# Patient Record
Sex: Female | Born: 1990 | Race: Black or African American | Hispanic: No | Marital: Single | Smoking: Former smoker
Health system: Southern US, Community
[De-identification: ages and names within clinical notes are randomized; demographics above are authoritative.]

## PROBLEM LIST (undated history)

## (undated) DIAGNOSIS — F319 Bipolar disorder, unspecified: Secondary | ICD-10-CM

## (undated) DIAGNOSIS — A749 Chlamydial infection, unspecified: Secondary | ICD-10-CM

## (undated) DIAGNOSIS — F410 Panic disorder [episodic paroxysmal anxiety] without agoraphobia: Secondary | ICD-10-CM

## (undated) DIAGNOSIS — F32A Depression, unspecified: Secondary | ICD-10-CM

## (undated) DIAGNOSIS — F329 Major depressive disorder, single episode, unspecified: Secondary | ICD-10-CM

## (undated) DIAGNOSIS — F419 Anxiety disorder, unspecified: Secondary | ICD-10-CM

---
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
## 2011-01-22 ENCOUNTER — Emergency Department (HOSPITAL_BASED_OUTPATIENT_CLINIC_OR_DEPARTMENT_OTHER)
Admission: EM | Admit: 2011-01-22 | Discharge: 2011-01-22 | Disposition: A | Discharge status: Home / Self Care | Payer: Self-pay | Attending: Emergency Medicine | Admitting: Emergency Medicine

## 2011-01-22 ENCOUNTER — Encounter: Payer: Self-pay | Admitting: Emergency Medicine

## 2011-01-22 DIAGNOSIS — B9689 Other specified bacterial agents as the cause of diseases classified elsewhere: Secondary | ICD-10-CM | POA: Insufficient documentation

## 2011-01-22 DIAGNOSIS — A499 Bacterial infection, unspecified: Secondary | ICD-10-CM | POA: Insufficient documentation

## 2011-01-22 DIAGNOSIS — N760 Acute vaginitis: Secondary | ICD-10-CM | POA: Insufficient documentation

## 2011-01-22 DIAGNOSIS — B009 Herpesviral infection, unspecified: Secondary | ICD-10-CM | POA: Insufficient documentation

## 2011-01-22 DIAGNOSIS — R109 Unspecified abdominal pain: Secondary | ICD-10-CM | POA: Insufficient documentation

## 2011-01-22 HISTORY — DX: Chlamydial infection, unspecified: A74.9

## 2011-01-22 LAB — URINALYSIS, ROUTINE W REFLEX MICROSCOPIC
Glucose, UA: NEGATIVE mg/dL
Ketones, ur: 15 mg/dL — AB
Leukocytes, UA: NEGATIVE
Nitrite: NEGATIVE
Protein, ur: NEGATIVE mg/dL
pH: 5.5 (ref 5.0–8.0)

## 2011-01-22 LAB — WET PREP, GENITAL: Trich, Wet Prep: NONE SEEN

## 2011-01-22 LAB — PREGNANCY, URINE: Preg Test, Ur: NEGATIVE

## 2011-01-22 LAB — URINE MICROSCOPIC-ADD ON

## 2011-01-22 MED ORDER — VALACYCLOVIR HCL 1 G PO TABS: ORAL_TABLET | ORAL | Status: DC

## 2011-01-22 MED ORDER — METRONIDAZOLE 500 MG PO TABS: 500.0000 mg | ORAL_TABLET | Freq: Two times a day (BID) | ORAL | Status: AC

## 2011-01-22 NOTE — ED Provider Notes (Signed)
History  Scribed for Pamela Cooper III, MD, the patient was seen in [facility]. The chart was scribed by Gilman Schmidt. The patients care was started at 4:21 PM.   CSN: [id number] Arrival date & time: 01/22/2011  4:04 PM   First MD Initiated Contact with Patient 01/22/11 1614      Chief Complaint  Patient presents with  . Abdominal Pain    HPI Pamela Obrien is a 20 y.o. female who presents to the Emergency Department complaining of abdominal pain. Pt was seen at [facility] two weeks ago for abdominal pain & "was tested for everything"- +for chlamydia- tx completed- . States that symptoms of back pain, abdominal pain, N/D, chest pain, and irritability still persists. Pt also notes cold sore on lip onset today. Denies any fever, cough, sneezing, vomiting, runny nose, or any urinary symptoms. LNMP: October. Notes she has had vaginal bleeding that is possibly from STD. There are no other associated symptoms and no other alleviating or aggravating factors.   Past Medical History  Diagnosis Date  . Chlamydia     History reviewed. No pertinent past surgical history.  History reviewed. No pertinent family history.  History  Substance Use Topics  . Smoking status: Current Some Day Smoker  . Smokeless tobacco: Not on file  . Alcohol Use: Yes     occ    OB History    Grav Para Term Preterm Abortions TAB SAB Ect Mult Living                  Review of Systems  HENT: Negative for rhinorrhea.   Cardiovascular: Positive for chest pain.  Gastrointestinal: Positive for nausea and abdominal pain. Negative for vomiting and diarrhea.  Genitourinary: Negative for dysuria, urgency, enuresis and difficulty urinating.  Musculoskeletal: Positive for back pain.  Skin: Negative for rash.  Neurological: Negative for dizziness and syncope.  All other systems reviewed and are negative.    Allergies  Review of patient's allergies indicates no known allergies.  Home Medications   Current  Outpatient Rx  Name Route Sig Dispense Refill  . ETONOGESTREL 68 MG [place] IMPL Subcutaneous Inject 1 each into the skin once. Implanted 02/17/2010     . METRONIDAZOLE 500 MG PO TABS Oral Take 1 tablet (500 mg total) by mouth 2 (two) times daily. 14 tablet 0  . VALACYCLOVIR HCL 1 G PO TABS  Take 2 tablets every 12 hours for two doses. 4 tablet 0    BP 133/76  Pulse 94  Temp(Src) 98.4 F (36.9 C) (Oral)  Resp 20  SpO2 100%  Physical Exam  Constitutional: She is oriented to person, place, and time. She appears well-developed and well-nourished.  Non-toxic appearance. She does not have a sickly appearance.  HENT:  Head: Normocephalic and atraumatic.       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 Pupils are equal, round, and reactive to light. No scleral icterus.  Neck: Trachea normal and normal range of motion. Neck supple.  Cardiovascular: Regular rhythm and normal heart sounds.   Pulmonary/Chest: Effort normal and breath sounds normal.  Abdominal: Soft. Normal appearance. There is tenderness in the suprapubic area. There is no rebound, no guarding and no CVA tenderness.  Genitourinary: Uterus normal. Right adnexum displays no tenderness. Left adnexum displays no tenderness.       Normal external genitalia Blood purulent discharge  Musculoskeletal: Normal range of motion.       Lumbar back: She exhibits  tenderness.  Neurological: She is alert and oriented to person, place, and time. She has normal strength.  Skin: Skin is warm, dry and intact. No rash noted.    ED Course  Procedures  DIAGNOSTIC STUDIES: Oxygen Saturation is 100% on room air, normal by my interpretation.    COORDINATION OF CARE: 4:21pm:  - Patient evaluated by ED physician, UA, Pregnancy, Urine Culture, GC, Wet prep ordered  Results for orders placed during the hospital encounter of 01/22/11  URINALYSIS, ROUTINE W REFLEX MICROSCOPIC      Component Value Range   Color, Urine YELLOW  YELLOW     Appearance CLOUDY (*) CLEAR    Specific Gravity, Urine 1.030  1.005 - 1.030    pH 5.5  5.0 - 8.0    Glucose, UA NEGATIVE  NEGATIVE (mg/dL)   Hgb urine dipstick LARGE (*) NEGATIVE    Bilirubin Urine SMALL (*) NEGATIVE    Ketones, ur 15 (*) NEGATIVE (mg/dL)   Protein, ur NEGATIVE  NEGATIVE (mg/dL)   Urobilinogen, UA 0.2  0.0 - 1.0 (mg/dL)   Nitrite NEGATIVE  NEGATIVE    Leukocytes, UA NEGATIVE  NEGATIVE   PREGNANCY, URINE      Component Value Range   Preg Test, Ur NEGATIVE    WET PREP, GENITAL      Component Value Range   Yeast, Wet Prep NONE SEEN  NONE SEEN    Trich, Wet Prep NONE SEEN  NONE SEEN    Clue Cells, Wet Prep TOO NUMEROUS TO COUNT (*) NONE SEEN    WBC, Wet Prep HPF POC FEW (*) NONE SEEN   URINE MICROSCOPIC-ADD ON      Component Value Range   Squamous Epithelial / LPF MANY (*) RARE    WBC, UA 0-2  <3 (WBC/hpf)   RBC / HPF 0-2  <3 (RBC/hpf)   Bacteria, UA MANY (*) RARE    Urine-Other MUCOUS PRESENT     7:05 PM Lab workup shows bacterial vaginosis.  Rx metronidazole. . I personally performed the services described in this documentation, which was scribed in my presence. The recorded information has been reviewed and considered.  Osvaldo Human, M.D.       Pamela Cooper III, MD 01/22/11 [phone]

## 2011-01-22 NOTE — ED Notes (Signed)
Pt was seen at [facility] 2 wks ago for abd pain & "was tested for everything"- +for chlamydia- tx completed- still c/o BLQ pain & low back pain; reports vaginal d/c (watery, white)

## 2011-01-23 LAB — URINE CULTURE
Colony Count: 6000
Culture  Setup Time: 201211181943

## 2011-01-23 LAB — GC/CHLAMYDIA PROBE AMP, GENITAL: Chlamydia, DNA Probe: NEGATIVE

## 2011-02-09 IMAGING — CR DG CHEST 2V
2 series · 2 of 2 positions shown · non-contrast
Comparison: None

CLINICAL DATA: Chest pain.  Anxiety attack.  Syncope.  6 weeks
pregnant.

CHEST - 2 VIEW

[w chest pa]
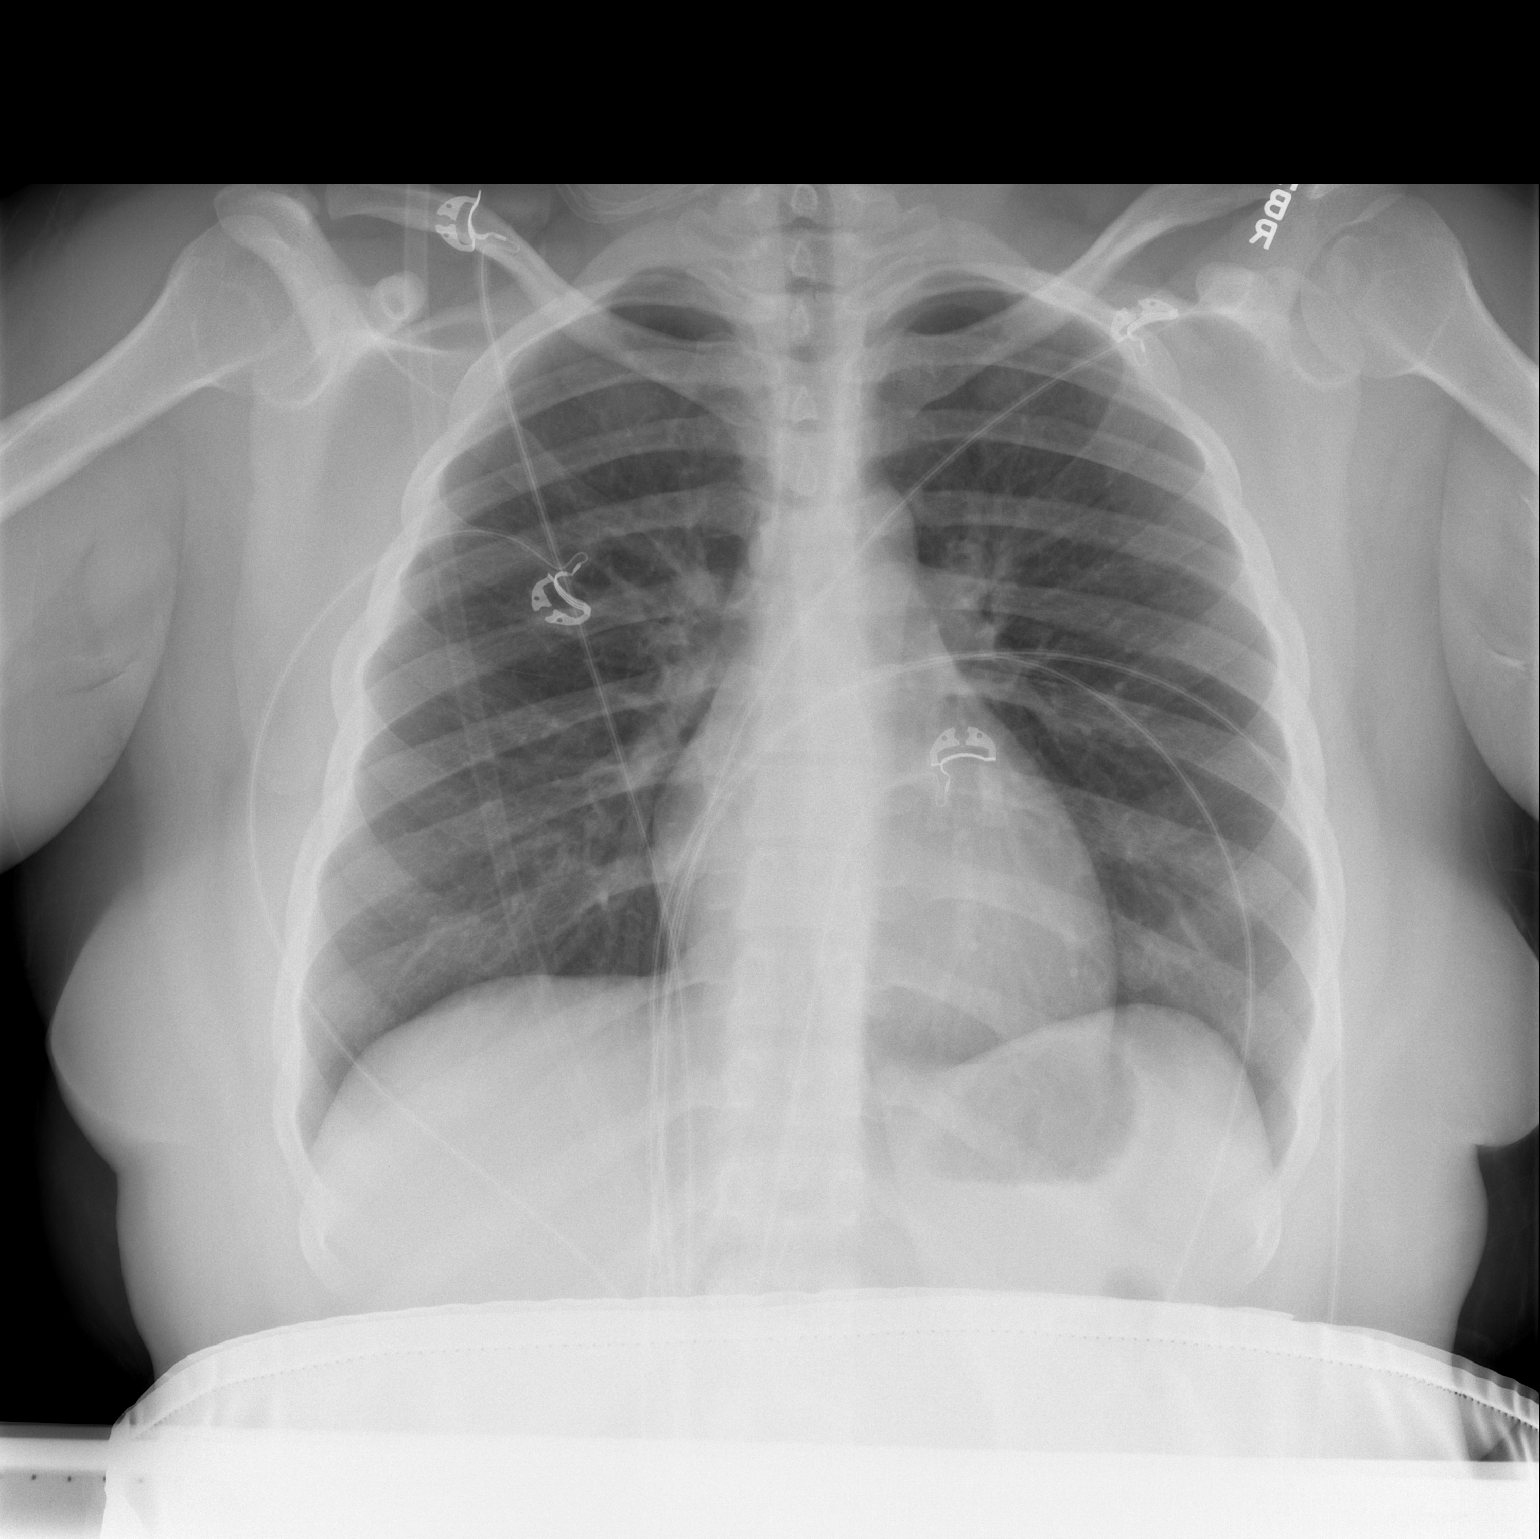

[w chest lat]
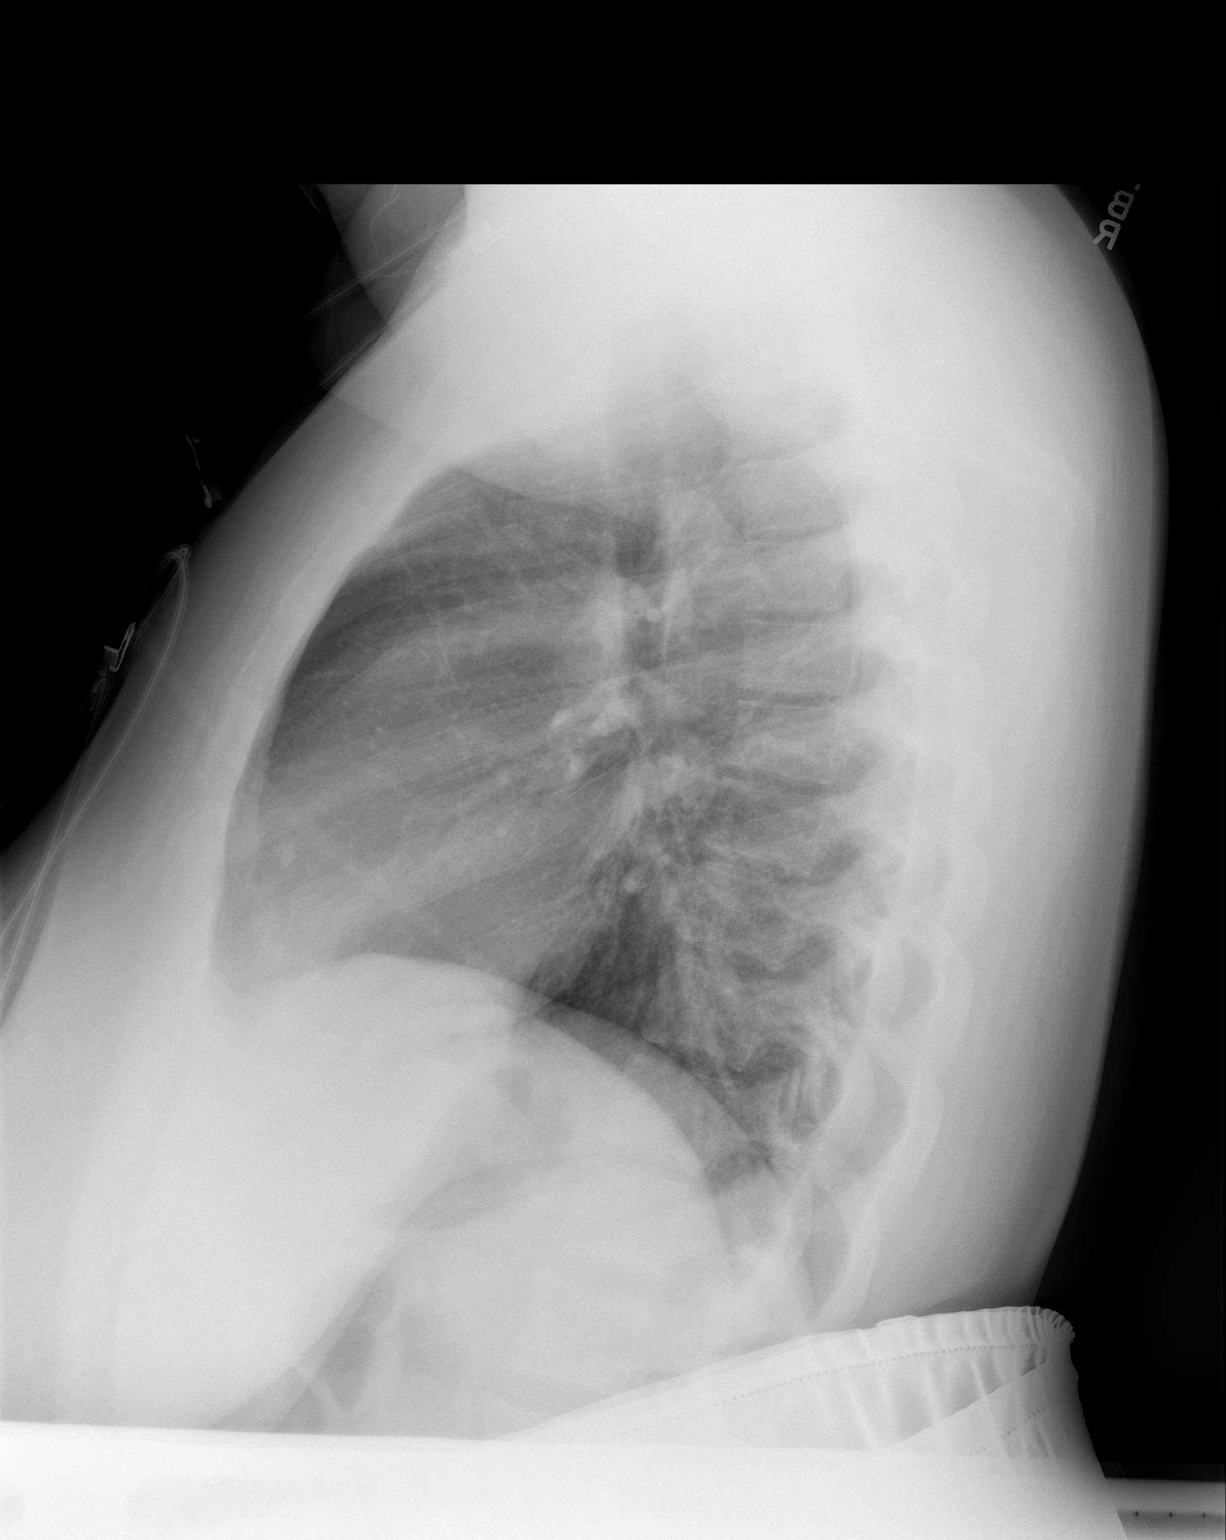

[2 of 2 positions shown; findings below may reference images not displayed]

FINDINGS: Midline trachea.  Normal heart size and mediastinal
contours. No pleural effusion or pneumothorax.  Clear lungs.
IMPRESSION: No acute cardiopulmonary disease.

## 2011-02-09 IMAGING — US US OB COMP LESS 14 WK
1 series · 14 of 28 positions shown · non-contrast
Comparison: None.

CLINICAL DATA: Pelvic pain and cramping.  Vaginal bleeding. 6-week-
4-day gestational age by LMP

OBSTETRIC <14 WK US AND TRANSVAGINAL OB US
TECHNIQUE: Both transabdominal and transvaginal ultrasound
examinations were performed for complete evaluation of the
gestation as well as the maternal uterus, adnexal regions, and
pelvic cul-de-sac.

[Series 1: us ob comp less 14 wk · 0.24mm/px · 14 of 57 slices shown]
[im 3/57]
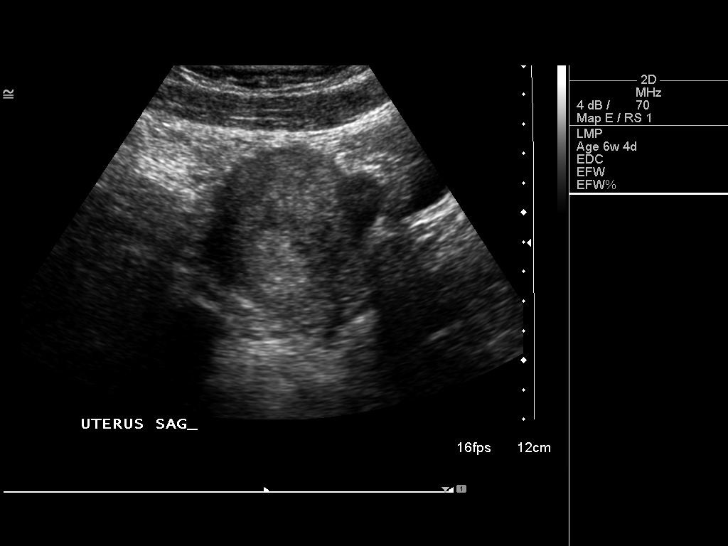
[im 7/57]
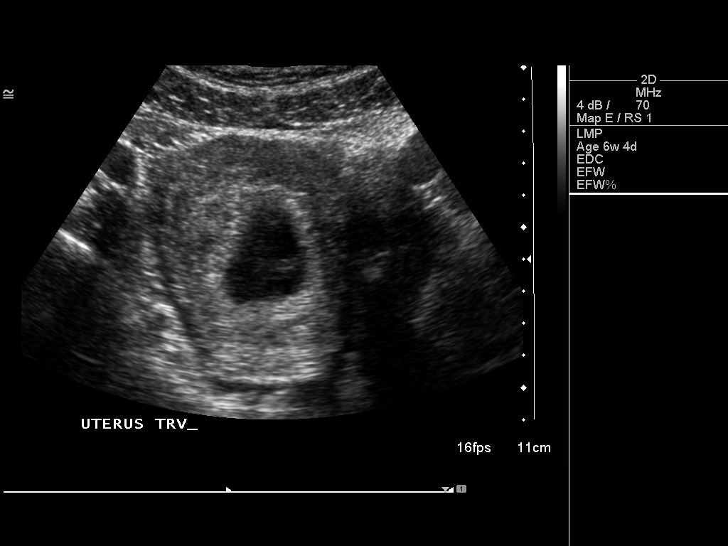
[im 11/57]
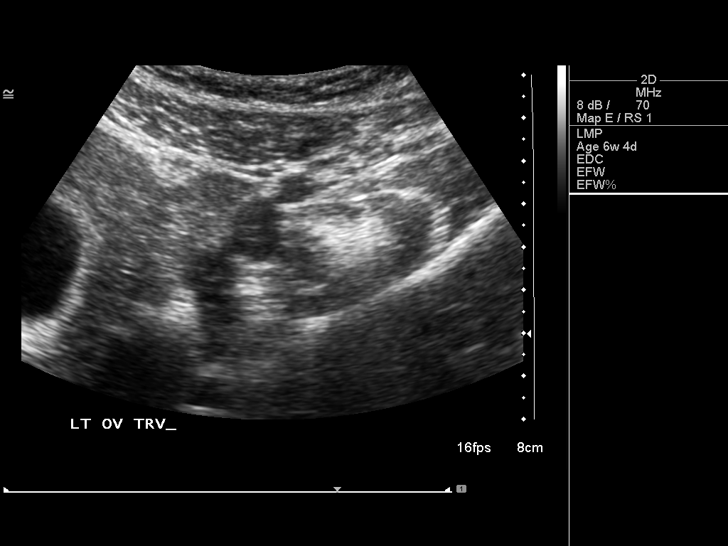
[im 15/57]
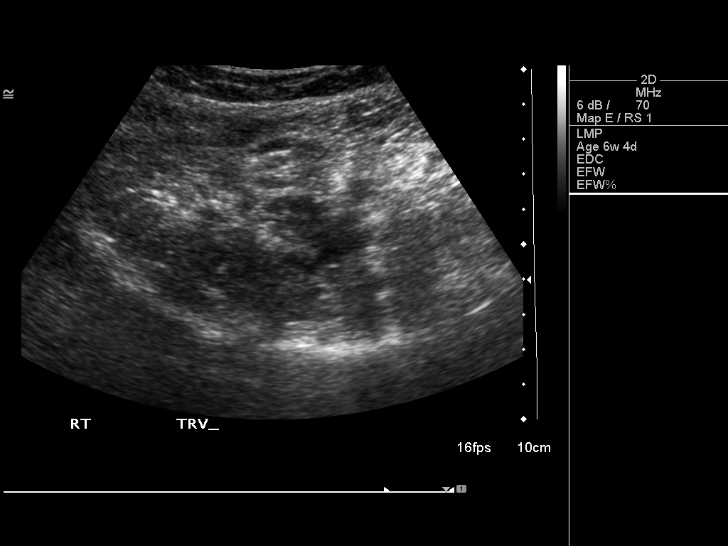
[im 19/57]
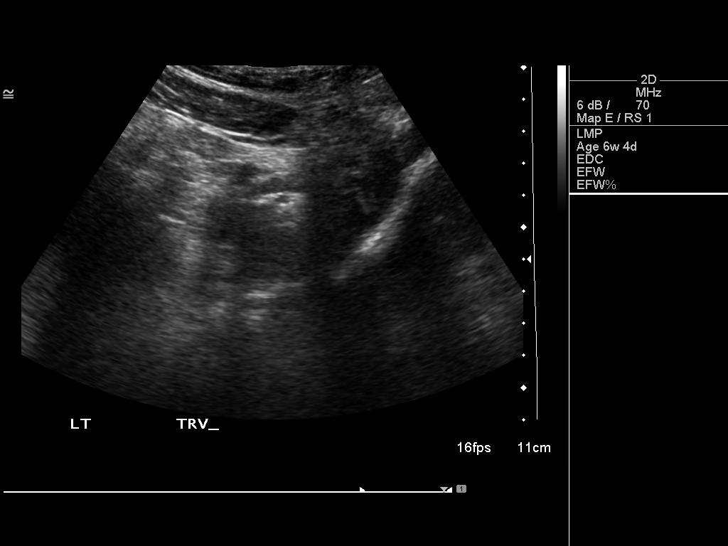
[im 23/57]
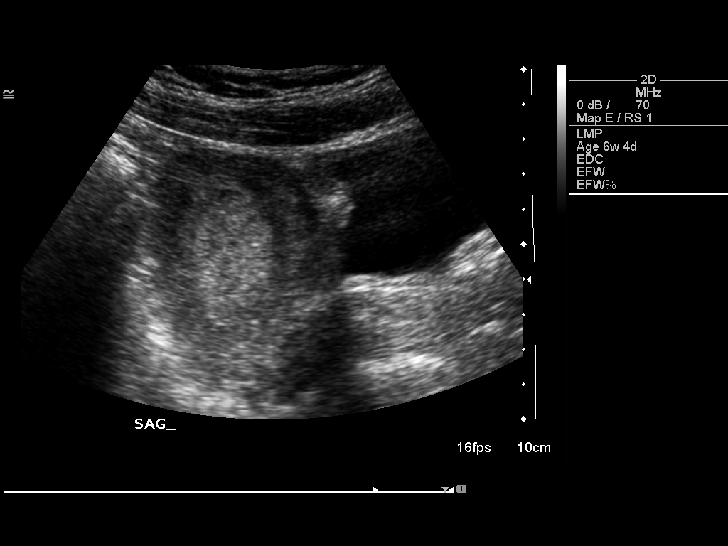
[im 27/57]
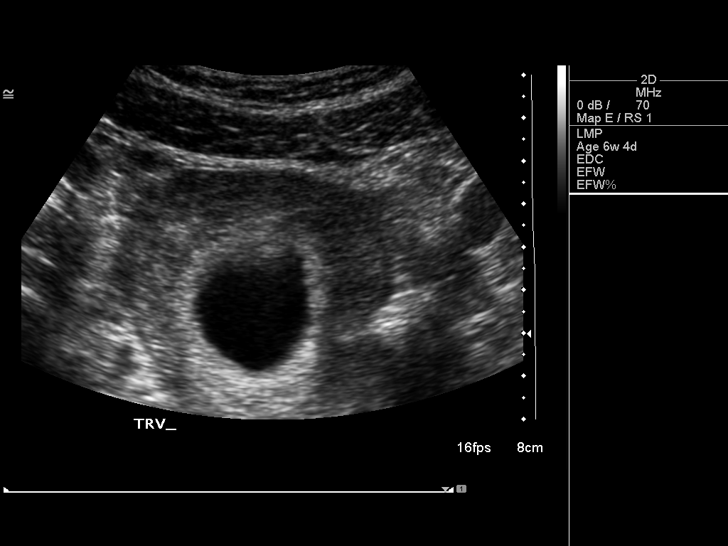
[im 32/57]
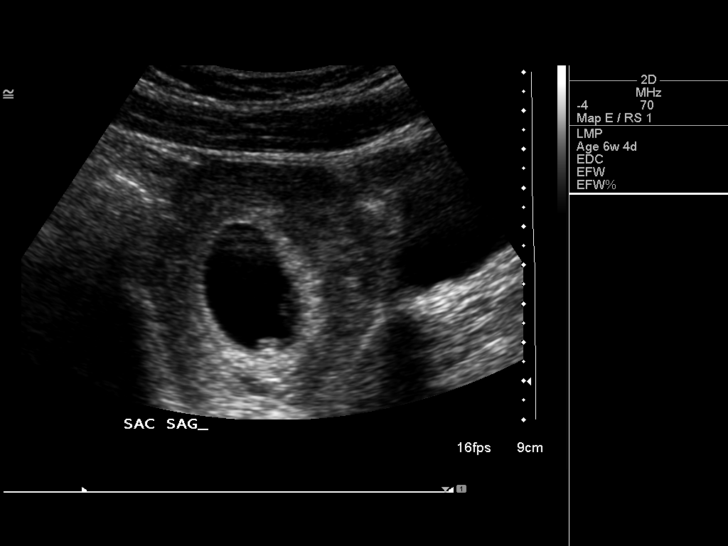
[im 36/57]
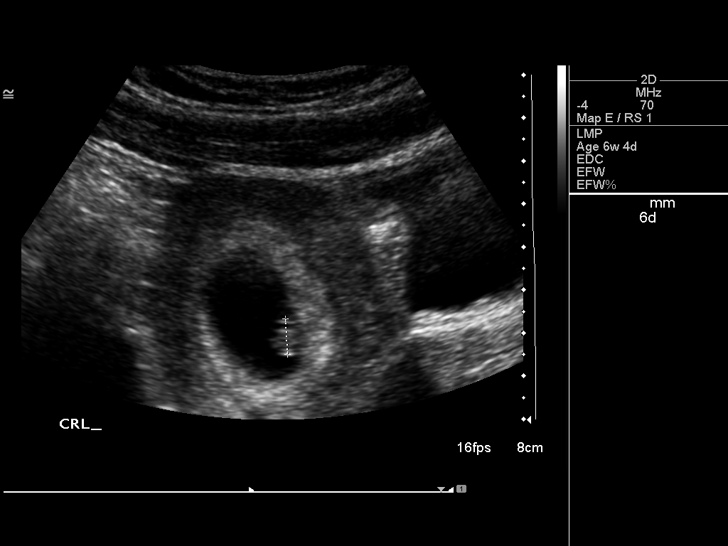
[im 40/57]
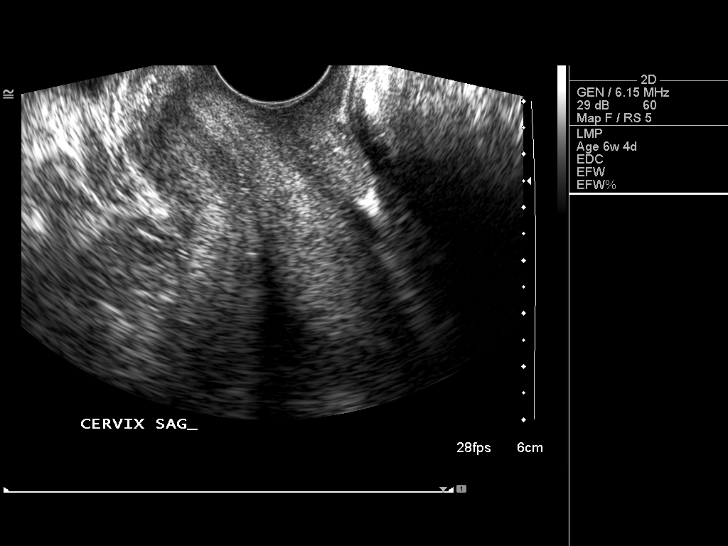
[im 44/57]
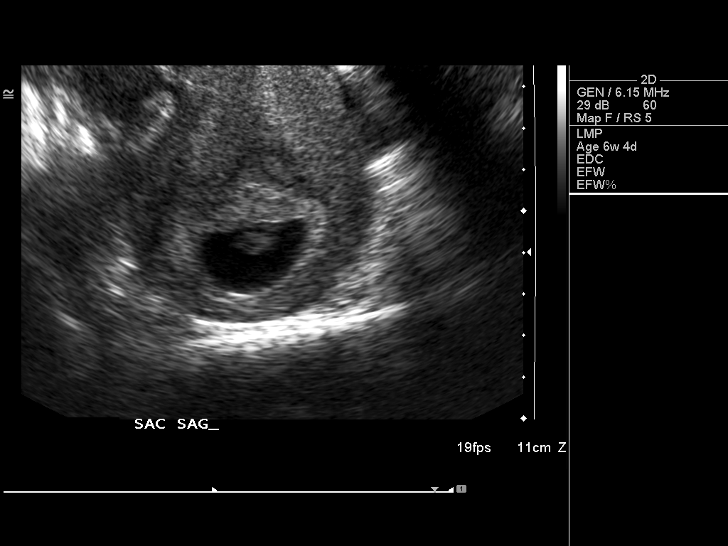
[im 48/57]
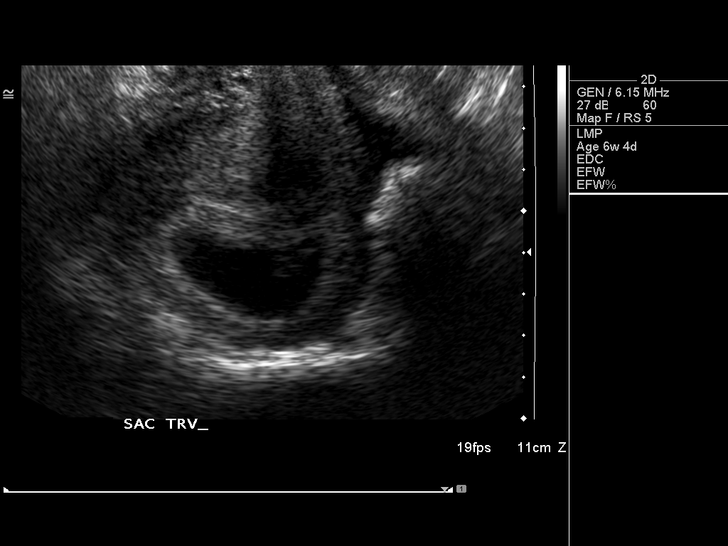
[im 52/57]
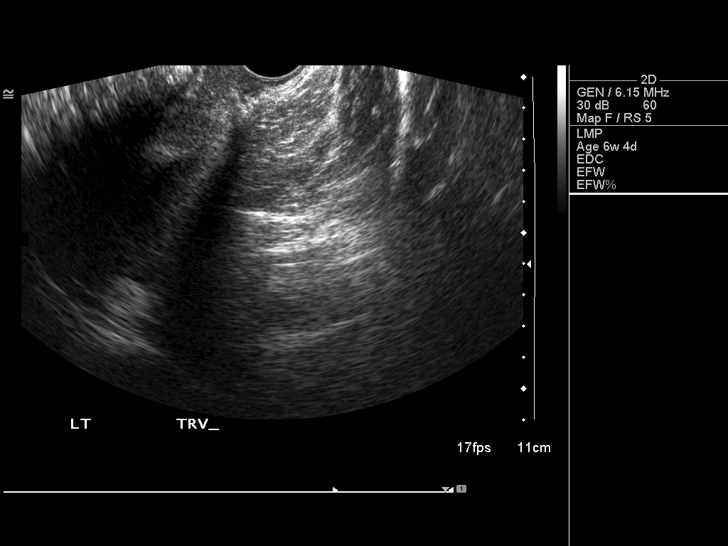
[im 57/57]
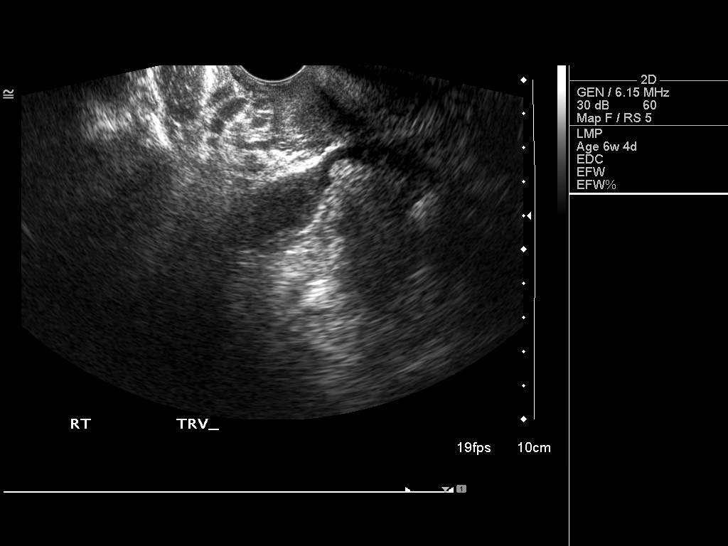

[14 of 28 positions shown; findings below may reference images not displayed]

Intrauterine gestational sac: Single
Yolk sac: Visualized
Embryo: Visualized
Cardiac Activity: Visualized
Heart Rate: 144 bpm

CRL: 9 mm           6   w  6   d          US EDC: 11/05/2009

Maternal uterus/adnexae:
Normal ovaries.  No evidence of adnexal mass.  Trace amount of free
fluid noted.
IMPRESSION: 1.  Single living IUP, with ultrasound EGA concordant with LMP.
2.  No evidence of adnexal mass.

## 2011-03-30 ENCOUNTER — Encounter (HOSPITAL_BASED_OUTPATIENT_CLINIC_OR_DEPARTMENT_OTHER): Payer: Self-pay | Admitting: *Deleted

## 2011-03-30 ENCOUNTER — Emergency Department (HOSPITAL_BASED_OUTPATIENT_CLINIC_OR_DEPARTMENT_OTHER)
Admission: EM | Admit: 2011-03-30 | Discharge: 2011-03-30 | Disposition: A | Discharge status: Home / Self Care | Payer: Self-pay | Attending: Emergency Medicine | Admitting: Emergency Medicine

## 2011-03-30 DIAGNOSIS — H9209 Otalgia, unspecified ear: Secondary | ICD-10-CM | POA: Insufficient documentation

## 2011-03-30 DIAGNOSIS — R509 Fever, unspecified: Secondary | ICD-10-CM | POA: Insufficient documentation

## 2011-03-30 DIAGNOSIS — J069 Acute upper respiratory infection, unspecified: Secondary | ICD-10-CM | POA: Insufficient documentation

## 2011-03-30 DIAGNOSIS — R51 Headache: Secondary | ICD-10-CM | POA: Insufficient documentation

## 2011-03-30 MED ORDER — AMOXICILLIN 500 MG PO CAPS: 500.0000 mg | ORAL_CAPSULE | Freq: Three times a day (TID) | ORAL | Status: AC

## 2011-03-30 MED ORDER — HYDROCOD POLST-CHLORPHEN POLST 10-8 MG/5ML PO LQCR: 5.0000 mL | Freq: Two times a day (BID) | ORAL | Status: DC | PRN

## 2011-03-30 NOTE — ED Notes (Signed)
Cough sore throat ear pain headache sinus pressure sinus drainage unable to sleep at night due to coughing

## 2011-03-30 NOTE — ED Provider Notes (Signed)
History     CSN: [id number]  Arrival date & time 03/30/11  1539   First MD Initiated Contact with Patient 03/30/11 1614      No chief complaint on file.   (Consider location/radiation/quality/duration/timing/severity/associated sxs/prior treatment) Patient is a 21 y.o. female presenting with URI. The history is provided by the patient.  URI The primary symptoms include fever, fatigue, headaches, ear pain, sore throat and cough. The current episode started more than 1 week ago. This is a new problem. The problem has been gradually worsening.    Past Medical History  Diagnosis Date  . Chlamydia     History reviewed. No pertinent past surgical history.  History reviewed. No pertinent family history.  History  Substance Use Topics  . Smoking status: Current Some Day Smoker  . Smokeless tobacco: Not on file  . Alcohol Use: Yes     occ    OB History    Grav Para Term Preterm Abortions TAB SAB Ect Mult Living                  Review of Systems  Constitutional: Positive for fever and fatigue.  HENT: Positive for ear pain and sore throat.   Respiratory: Positive for cough.   Neurological: Positive for headaches.  All other systems reviewed and are negative.    Allergies  Review of patient's allergies indicates no known allergies.  Home Medications   Current Outpatient Rx  Name Route Sig Dispense Refill  . ACETAMINOPHEN 500 MG PO TABS Oral Take 1,000 mg by mouth every 6 (six) hours as needed. For pain    . DIPHENHYDRAMINE-PE-APAP 12.5-5-325 MG/15ML PO LIQD Oral Take 15 mLs by mouth once as needed. For cold symptoms    . IBUPROFEN 200 MG PO TABS Oral Take 600 mg by mouth every 6 (six) hours as needed. For pain    . ETONOGESTREL 68 MG  IMPL Subcutaneous Inject 1 each into the skin once. Implanted 02/17/2010       BP 132/78  Pulse 80  Temp(Src) 98.4 F (36.9 C) (Oral)  Resp 20  SpO2 100%  Physical Exam  Nursing note and vitals reviewed. Constitutional: She  is oriented to person, place, and time. She appears well-developed and well-nourished. No distress.  HENT:  Head: Normocephalic and atraumatic.  Neck: Normal range of motion. Neck supple.  Cardiovascular: Normal rate and regular rhythm.  Exam reveals no gallop and no friction rub.   No murmur heard. Pulmonary/Chest: Effort normal and breath sounds normal. No respiratory distress. She has no wheezes.  Abdominal: Soft. Bowel sounds are normal. She exhibits no distension. There is no tenderness.  Musculoskeletal: Normal range of motion.  Neurological: She is alert and oriented to person, place, and time.  Skin: Skin is warm and dry. She is not diaphoretic.    ED Course  Procedures (including critical care time)  Labs Reviewed - No data to display No results found.   No diagnosis found.    MDM  I suspect a viral process, however symptoms have been present for over one week and worsening.  I will prescribe amox and tussionex.  She is to wait 1-2 to fill the antibiotic if she is not improving.        Geoffery Lyons, MD 03/30/11 1623

## 2012-08-24 ENCOUNTER — Emergency Department (HOSPITAL_BASED_OUTPATIENT_CLINIC_OR_DEPARTMENT_OTHER)
Admission: EM | Admit: 2012-08-24 | Discharge: 2012-08-24 | Disposition: A | Discharge status: Home / Self Care | Payer: Self-pay | Attending: Emergency Medicine | Admitting: Emergency Medicine

## 2012-08-24 ENCOUNTER — Encounter (HOSPITAL_BASED_OUTPATIENT_CLINIC_OR_DEPARTMENT_OTHER): Payer: Self-pay | Admitting: *Deleted

## 2012-08-24 DIAGNOSIS — T63481A Toxic effect of venom of other arthropod, accidental (unintentional), initial encounter: Secondary | ICD-10-CM | POA: Insufficient documentation

## 2012-08-24 DIAGNOSIS — M545 Low back pain, unspecified: Secondary | ICD-10-CM | POA: Insufficient documentation

## 2012-08-24 DIAGNOSIS — A599 Trichomoniasis, unspecified: Secondary | ICD-10-CM

## 2012-08-24 DIAGNOSIS — J3489 Other specified disorders of nose and nasal sinuses: Secondary | ICD-10-CM | POA: Insufficient documentation

## 2012-08-24 DIAGNOSIS — B9689 Other specified bacterial agents as the cause of diseases classified elsewhere: Secondary | ICD-10-CM

## 2012-08-24 DIAGNOSIS — Y9389 Activity, other specified: Secondary | ICD-10-CM | POA: Insufficient documentation

## 2012-08-24 DIAGNOSIS — R42 Dizziness and giddiness: Secondary | ICD-10-CM | POA: Insufficient documentation

## 2012-08-24 DIAGNOSIS — A5901 Trichomonal vulvovaginitis: Secondary | ICD-10-CM | POA: Insufficient documentation

## 2012-08-24 DIAGNOSIS — J029 Acute pharyngitis, unspecified: Secondary | ICD-10-CM | POA: Insufficient documentation

## 2012-08-24 DIAGNOSIS — F17200 Nicotine dependence, unspecified, uncomplicated: Secondary | ICD-10-CM | POA: Insufficient documentation

## 2012-08-24 DIAGNOSIS — Y9289 Other specified places as the place of occurrence of the external cause: Secondary | ICD-10-CM | POA: Insufficient documentation

## 2012-08-24 DIAGNOSIS — R112 Nausea with vomiting, unspecified: Secondary | ICD-10-CM | POA: Insufficient documentation

## 2012-08-24 DIAGNOSIS — T6391XA Toxic effect of contact with unspecified venomous animal, accidental (unintentional), initial encounter: Secondary | ICD-10-CM | POA: Insufficient documentation

## 2012-08-24 DIAGNOSIS — N760 Acute vaginitis: Secondary | ICD-10-CM | POA: Insufficient documentation

## 2012-08-24 DIAGNOSIS — Z8619 Personal history of other infectious and parasitic diseases: Secondary | ICD-10-CM | POA: Insufficient documentation

## 2012-08-24 DIAGNOSIS — R51 Headache: Secondary | ICD-10-CM | POA: Insufficient documentation

## 2012-08-24 DIAGNOSIS — N898 Other specified noninflammatory disorders of vagina: Secondary | ICD-10-CM | POA: Insufficient documentation

## 2012-08-24 DIAGNOSIS — Z3202 Encounter for pregnancy test, result negative: Secondary | ICD-10-CM | POA: Insufficient documentation

## 2012-08-24 DIAGNOSIS — Z8659 Personal history of other mental and behavioral disorders: Secondary | ICD-10-CM | POA: Insufficient documentation

## 2012-08-24 DIAGNOSIS — Y990 Civilian activity done for income or pay: Secondary | ICD-10-CM | POA: Insufficient documentation

## 2012-08-24 DIAGNOSIS — R21 Rash and other nonspecific skin eruption: Secondary | ICD-10-CM | POA: Insufficient documentation

## 2012-08-24 LAB — WET PREP, GENITAL

## 2012-08-24 LAB — BASIC METABOLIC PANEL
Calcium: 9.2 mg/dL (ref 8.4–10.5)
Creatinine, Ser: 0.8 mg/dL (ref 0.50–1.10)
GFR calc Af Amer: >90 mL/min (ref >90)

## 2012-08-24 LAB — PREGNANCY, URINE: Preg Test, Ur: NEGATIVE

## 2012-08-24 LAB — URINALYSIS, ROUTINE W REFLEX MICROSCOPIC
Ketones, ur: NEGATIVE mg/dL
Leukocytes, UA: NEGATIVE
Nitrite: NEGATIVE
Protein, ur: NEGATIVE mg/dL
pH: 6 (ref 5.0–8.0)

## 2012-08-24 LAB — CBC WITH DIFFERENTIAL/PLATELET
Basophils Absolute: 0 10*3/uL (ref 0.0–0.1)
Eosinophils Absolute: 0.1 10*3/uL (ref 0.0–0.7)
Eosinophils Relative: 2 % (ref 0–5)
Lymphocytes Relative: 35 % (ref 12–46)
MCV: 83.4 fL (ref 78.0–100.0)
Neutrophils Relative %: 54 % (ref 43–77)
Platelets: 216 10*3/uL (ref 150–400)
RDW: 12.7 % (ref 11.5–15.5)
WBC: 5.4 10*3/uL (ref 4.0–10.5)

## 2012-08-24 MED ORDER — DIPHENHYDRAMINE HCL 25 MG PO TABS: 25.0000 mg | ORAL_TABLET | Freq: Four times a day (QID) | ORAL | Status: DC

## 2012-08-24 MED ORDER — RANITIDINE HCL 150 MG PO TABS: 150.0000 mg | ORAL_TABLET | Freq: Two times a day (BID) | ORAL | Status: DC

## 2012-08-24 MED ORDER — METRONIDAZOLE 500 MG PO TABS: 500.0000 mg | ORAL_TABLET | Freq: Three times a day (TID) | ORAL | Status: DC

## 2012-08-24 MED ORDER — FAMOTIDINE 20 MG PO TABS
20.0000 mg | ORAL_TABLET | Freq: Once | ORAL | Status: AC
  Administered 2012-08-24: 20 mg via ORAL
  Filled 2012-08-24: qty 1

## 2012-08-24 MED ORDER — DIPHENHYDRAMINE HCL 25 MG PO CAPS
25.0000 mg | ORAL_CAPSULE | Freq: Once | ORAL | Status: AC
  Administered 2012-08-24: 25 mg via ORAL
  Filled 2012-08-24: qty 1

## 2012-08-24 NOTE — ED Notes (Signed)
Patient states that she thinks something was inside her shirt and bit her. C/o R arm, neck & back itching and burning

## 2012-08-24 NOTE — ED Provider Notes (Signed)
History     CSN: [id number]  Arrival date & time 08/24/12  1223   First MD Initiated Contact with Patient 08/24/12 1228      Chief Complaint  Patient presents with  . Pruritis    (Consider location/radiation/quality/duration/timing/severity/associated sxs/prior treatment) The history is provided by the patient.  Pamela Obrien is a 22 y.o. female who presents to the ED with multiple complaints. She states that for the past 2 weeks she has had difficulty sleeping. Wakes with cold sweats. Has had hives on chest and neck off and on. She has had pain in both thighs. She has had nausea and vomiting. Periods have been irregular since implant for birth control. Also complains of vaginal discharge with an odor. She has had low back pain. Also complains that today while at work she got stung by something on her neck and arm. Not sure what but pulled 2 black bugs off of her when the burning started. The history was provided by the patient.   Past Medical History  Diagnosis Date  . Chlamydia     History reviewed. No pertinent past surgical history.  No family history on file.  History  Substance Use Topics  . Smoking status: Current Some Day Smoker  . Smokeless tobacco: Not on file  . Alcohol Use: Yes     Comment: occ    OB History   Grav Para Term Preterm Abortions TAB SAB Ect Mult Living                  Review of Systems  Constitutional: Negative for fever and chills.  HENT: Positive for congestion and sore throat. Negative for neck pain and neck stiffness.   Eyes: Negative for visual disturbance.  Gastrointestinal: Positive for nausea, vomiting and abdominal pain.  Genitourinary: Positive for vaginal discharge. Negative for dysuria, urgency, frequency and vaginal bleeding.  Musculoskeletal: Positive for back pain.  Skin: Positive for rash.  Neurological: Positive for light-headedness and headaches.  Psychiatric/Behavioral: Negative for confusion. The patient is not  nervous/anxious.        Dx with depression 2 years ago but stopped medication    Allergies  Review of patient's allergies indicates no known allergies.  Home Medications   Current Outpatient Rx  Name  Route  Sig  Dispense  Refill  . acetaminophen (TYLENOL) 500 MG tablet   Oral   Take 1,000 mg by mouth every 6 (six) hours as needed. For pain         . chlorpheniramine-HYDROcodone (TUSSIONEX PENNKINETIC ER) 10-8 MG/5ML LQCR   Oral   Take 5 mLs by mouth every 12 (twelve) hours as needed.   75 mL   0   . Diphenhydramine-PE-APAP (THERAFLU WARMING RELIEF FLU) 12.5-5-325 MG/15ML LIQD   Oral   Take 15 mLs by mouth once as needed. For cold symptoms         . etonogestrel (IMPLANON) 68 MG IMPL implant   Subcutaneous   Inject 1 each into the skin once. Implanted 02/17/2010          . ibuprofen (ADVIL,MOTRIN) 200 MG tablet   Oral   Take 600 mg by mouth every 6 (six) hours as needed. For pain           BP 126/76  Pulse 75  Temp(Src) 98.7 F (37.1 C) (Oral)  Resp 19  SpO2 99%  Physical Exam  Nursing note and vitals reviewed. Constitutional: She is oriented to person, place, and time. She appears well-developed and well-nourished.  No distress.  HENT:  Head: Normocephalic.  Right Ear: Tympanic membrane normal.  Left Ear: Tympanic membrane normal.  Nose: Nose normal.  Mouth/Throat: Uvula is midline, oropharynx is clear and moist and mucous membranes are normal.  Eyes: EOM are normal.  Neck: Neck supple.  Small red area noted with itching right side of neck.   Cardiovascular: Normal rate, regular rhythm and normal heart sounds.   Pulmonary/Chest: Effort normal and breath sounds normal.  Abdominal: Soft. Bowel sounds are normal. There is no rebound, no guarding and no CVA tenderness.  Minimal tenderness with deep palpation lower abdomen.  Genitourinary:  External genitalia without lesions. Frothy bloody discharge vaginal vault. No CMT, no adnexal tenderness, uterus  without palpable enlargement.  Musculoskeletal:       Right elbow: She exhibits normal range of motion, no swelling and no deformity. No tenderness found.  Small area of erythema palmar aspect of right upper arm with itching.  Neurological: She is alert and oriented to person, place, and time. No cranial nerve deficit.  Skin: Skin is warm and dry.  Psychiatric: She has a normal mood and affect.   Results for orders placed during the hospital encounter of 08/24/12 (from the past 24 hour(s))  URINALYSIS, ROUTINE W REFLEX MICROSCOPIC     Status: None   Collection Time    08/24/12  1:04 PM      Result Value Range   Color, Urine YELLOW  YELLOW   APPearance CLEAR  CLEAR   Specific Gravity, Urine 1.027  1.005 - 1.030   pH 6.0  5.0 - 8.0   Glucose, UA NEGATIVE  NEGATIVE mg/dL   Hgb urine dipstick NEGATIVE  NEGATIVE   Bilirubin Urine NEGATIVE  NEGATIVE   Ketones, ur NEGATIVE  NEGATIVE mg/dL   Protein, ur NEGATIVE  NEGATIVE mg/dL   Urobilinogen, UA 0.2  0.0 - 1.0 mg/dL   Nitrite NEGATIVE  NEGATIVE   Leukocytes, UA NEGATIVE  NEGATIVE  PREGNANCY, URINE     Status: None   Collection Time    08/24/12  1:04 PM      Result Value Range   Preg Test, Ur NEGATIVE  NEGATIVE  WET PREP, GENITAL     Status: Abnormal   Collection Time    08/24/12  1:11 PM      Result Value Range   Yeast Wet Prep HPF POC FEW (*) NONE SEEN   Trich, Wet Prep FEW (*) NONE SEEN   Clue Cells Wet Prep HPF POC MODERATE (*) NONE SEEN   WBC, Wet Prep HPF POC FEW (*) NONE SEEN  CBC WITH DIFFERENTIAL     Status: None   Collection Time    08/24/12  1:15 PM      Result Value Range   WBC 5.4  4.0 - 10.5 K/uL   RBC 4.52  3.87 - 5.11 MIL/uL   Hemoglobin 13.4  12.0 - 15.0 g/dL   HCT 40.9  81.1 - 91.4 %   MCV 83.4  78.0 - 100.0 fL   MCH 29.6  26.0 - 34.0 pg   MCHC 35.5  30.0 - 36.0 g/dL   RDW 78.2  95.6 - 21.3 %   Platelets 216  150 - 400 K/uL   Neutrophils Relative % 54  43 - 77 %   Neutro Abs 2.9  1.7 - 7.7 K/uL    Lymphocytes Relative 35  12 - 46 %   Lymphs Abs 1.9  0.7 - 4.0 K/uL   Monocytes Relative 9  3 - 12 %   Monocytes Absolute 0.5  0.1 - 1.0 K/uL   Eosinophils Relative 2  0 - 5 %   Eosinophils Absolute 0.1  0.0 - 0.7 K/uL   Basophils Relative 0  0 - 1 %   Basophils Absolute 0.0  0.0 - 0.1 K/uL     ED Course  Procedures (including critical care time)   MDM  22 y.o. female with mild localized allergic reaction to insect bites to neck and right upper arm. Vaginal discharge with wet prep positive for trichomonas and BV. Will treat with antibiotics.  Discussed with the patient clinical and lab findings and plan of care and all questioned fully answered. She is to follow up with the health department for further STI testing and she will notify her sex partner that he needs treatment.  .   Medication List    TAKE these medications       diphenhydrAMINE 25 MG tablet  Commonly known as:  BENADRYL  Take 1 tablet (25 mg total) by mouth every 6 (six) hours.     metroNIDAZOLE 500 MG tablet  Commonly known as:  FLAGYL  Take 1 tablet (500 mg total) by mouth 3 (three) times daily.     ranitidine 150 MG tablet  Commonly known as:  ZANTAC  Take 1 tablet (150 mg total) by mouth 2 (two) times daily.      ASK your doctor about these medications       acetaminophen 500 MG tablet  Commonly known as:  TYLENOL  Take 1,000 mg by mouth every 6 (six) hours as needed. For pain     chlorpheniramine-HYDROcodone 10-8 MG/5ML Lqcr  Commonly known as:  TUSSIONEX PENNKINETIC ER  Take 5 mLs by mouth every 12 (twelve) hours as needed.     ibuprofen 200 MG tablet  Commonly known as:  ADVIL,MOTRIN  Take 600 mg by mouth every 6 (six) hours as needed. For pain     IMPLANON 68 MG Impl implant  Generic drug:  etonogestrel  Inject 1 each into the skin once. Implanted 02/17/2010     THERAFLU WARMING RELIEF FLU 12.5-5-325 MG/15ML Liqd  Generic drug:  Diphenhydramine-PE-APAP  Take 15 mLs by mouth once as  needed. For cold symptoms               Janne Napoleon, NP 08/24/12 1406

## 2012-08-25 NOTE — ED Provider Notes (Signed)
Medical screening examination/treatment/procedure(s) were performed by non-physician practitioner and as supervising physician I was immediately available for consultation/collaboration.    Nelia Shi, MD 08/25/12 [phone]

## 2013-03-02 ENCOUNTER — Encounter (HOSPITAL_BASED_OUTPATIENT_CLINIC_OR_DEPARTMENT_OTHER): Payer: Self-pay | Admitting: Emergency Medicine

## 2013-03-02 ENCOUNTER — Emergency Department (HOSPITAL_BASED_OUTPATIENT_CLINIC_OR_DEPARTMENT_OTHER): Payer: BC Managed Care – PPO

## 2013-03-02 ENCOUNTER — Emergency Department (HOSPITAL_BASED_OUTPATIENT_CLINIC_OR_DEPARTMENT_OTHER)
Admission: EM | Admit: 2013-03-02 | Discharge: 2013-03-03 | Disposition: A | Discharge status: Home / Self Care | Payer: BC Managed Care – PPO | Attending: Emergency Medicine | Admitting: Emergency Medicine

## 2013-03-02 DIAGNOSIS — N760 Acute vaginitis: Secondary | ICD-10-CM | POA: Insufficient documentation

## 2013-03-02 DIAGNOSIS — M549 Dorsalgia, unspecified: Secondary | ICD-10-CM | POA: Insufficient documentation

## 2013-03-02 DIAGNOSIS — Z792 Long term (current) use of antibiotics: Secondary | ICD-10-CM | POA: Insufficient documentation

## 2013-03-02 DIAGNOSIS — R51 Headache: Secondary | ICD-10-CM | POA: Insufficient documentation

## 2013-03-02 DIAGNOSIS — F17200 Nicotine dependence, unspecified, uncomplicated: Secondary | ICD-10-CM | POA: Insufficient documentation

## 2013-03-02 DIAGNOSIS — A5901 Trichomonal vulvovaginitis: Secondary | ICD-10-CM

## 2013-03-02 DIAGNOSIS — Z79899 Other long term (current) drug therapy: Secondary | ICD-10-CM | POA: Insufficient documentation

## 2013-03-02 DIAGNOSIS — R509 Fever, unspecified: Secondary | ICD-10-CM | POA: Insufficient documentation

## 2013-03-02 DIAGNOSIS — R062 Wheezing: Secondary | ICD-10-CM | POA: Insufficient documentation

## 2013-03-02 DIAGNOSIS — Z3202 Encounter for pregnancy test, result negative: Secondary | ICD-10-CM | POA: Insufficient documentation

## 2013-03-02 DIAGNOSIS — J020 Streptococcal pharyngitis: Secondary | ICD-10-CM | POA: Insufficient documentation

## 2013-03-02 DIAGNOSIS — B9689 Other specified bacterial agents as the cause of diseases classified elsewhere: Secondary | ICD-10-CM

## 2013-03-02 LAB — WET PREP, GENITAL

## 2013-03-02 LAB — URINALYSIS, ROUTINE W REFLEX MICROSCOPIC
Ketones, ur: 15 mg/dL — AB
Nitrite: NEGATIVE
Protein, ur: NEGATIVE mg/dL
Urobilinogen, UA: 0.2 mg/dL (ref 0.0–1.0)

## 2013-03-02 LAB — URINE MICROSCOPIC-ADD ON

## 2013-03-02 MED ORDER — GUAIFENESIN-CODEINE 100-10 MG/5ML PO SYRP: 5.0000 mL | ORAL_SOLUTION | Freq: Three times a day (TID) | ORAL | Status: DC | PRN

## 2013-03-02 MED ORDER — ALBUTEROL SULFATE (5 MG/ML) 0.5% IN NEBU
5.0000 mg | INHALATION_SOLUTION | Freq: Once | RESPIRATORY_TRACT | Status: AC
  Administered 2013-03-02: 5 mg via RESPIRATORY_TRACT
  Filled 2013-03-02: qty 1

## 2013-03-02 MED ORDER — PENICILLIN V POTASSIUM 500 MG PO TABS: 500.0000 mg | ORAL_TABLET | Freq: Three times a day (TID) | ORAL | Status: DC

## 2013-03-02 MED ORDER — GUAIFENESIN-CODEINE 100-10 MG/5ML PO SOLN
5.0000 mL | Freq: Once | ORAL | Status: AC
  Administered 2013-03-02: 5 mL via ORAL
  Filled 2013-03-02: qty 5

## 2013-03-02 MED ORDER — ONDANSETRON HCL 4 MG PO TABS: ORAL_TABLET | ORAL | Status: DC

## 2013-03-02 MED ORDER — ONDANSETRON 8 MG PO TBDP
8.0000 mg | ORAL_TABLET | Freq: Once | ORAL | Status: AC
  Administered 2013-03-02: 8 mg via ORAL
  Filled 2013-03-02: qty 1

## 2013-03-02 MED ORDER — PENICILLIN V POTASSIUM 250 MG PO TABS
500.0000 mg | ORAL_TABLET | Freq: Once | ORAL | Status: AC
  Administered 2013-03-02: 500 mg via ORAL
  Filled 2013-03-02: qty 2

## 2013-03-02 MED ORDER — METRONIDAZOLE 500 MG PO TABS: 500.0000 mg | ORAL_TABLET | Freq: Two times a day (BID) | ORAL | Status: DC

## 2013-03-02 NOTE — ED Notes (Signed)
Patient transported to X-ray 

## 2013-03-02 NOTE — ED Provider Notes (Signed)
CSN: [id number]     Arrival date & time 03/02/13  1512 History   First MD Initiated Contact with Patient 03/02/13 2055     Chief Complaint  Patient presents with  . URI   (Consider location/radiation/quality/duration/timing/severity/associated sxs/prior Treatment) Patient is a 22 y.o. female presenting with URI. The history is provided by the patient.  URI Presenting symptoms: congestion, cough, fever, rhinorrhea and sore throat   Presenting symptoms: no ear pain   Severity:  Moderate Onset quality:  Gradual Duration:  3 days Timing:  Constant Chronicity:  New Relieved by:  Nothing Worsened by:  Breathing Ineffective treatments:  OTC medications Associated symptoms: headaches, sinus pain, swollen glands and wheezing    Whitni Pasquini is a 22 y.o. female who presents to the ED with cough, cold, congestion, fever and back pain that started 3 days ago. Hurts really bad to swallow. Also complains of vaginal discharge. She has vaginal itching.   Past Medical History  Diagnosis Date  . Chlamydia    History reviewed. No pertinent past surgical history. History reviewed. No pertinent family history. History  Substance Use Topics  . Smoking status: Current Some Day Smoker -- 0.50 packs/day    Types: Cigarettes  . Smokeless tobacco: Not on file  . Alcohol Use: Yes     Comment: occ   OB History   Grav Para Term Preterm Abortions TAB SAB Ect Mult Living                 Review of Systems  Constitutional: Positive for fever.  HENT: Positive for congestion, rhinorrhea and sore throat. Negative for ear pain.   Eyes: Negative for redness and itching.  Respiratory: Positive for cough and wheezing.   Gastrointestinal: Negative for nausea, vomiting and abdominal pain.  Genitourinary: Positive for vaginal discharge. Negative for dysuria, frequency, decreased urine volume and difficulty urinating.  Musculoskeletal: Positive for back pain.  Skin: Negative for rash.  Allergic/Immunologic:  Negative for immunocompromised state.  Neurological: Positive for headaches. Negative for syncope.  Psychiatric/Behavioral: Negative for confusion. The patient is not nervous/anxious.     Allergies  Review of patient's allergies indicates no known allergies.  Home Medications   Current Outpatient Rx  Name  Route  Sig  Dispense  Refill  . etonogestrel (IMPLANON) 68 MG IMPL implant   Subcutaneous   Inject 1 each into the skin once. Implanted 02/17/2010          . guaiFENesin-codeine (ROBITUSSIN AC) 100-10 MG/5ML syrup   Oral   Take 5 mLs by mouth 3 (three) times daily as needed for cough.   120 mL   0   . metroNIDAZOLE (FLAGYL) 500 MG tablet   Oral   Take 1 tablet (500 mg total) by mouth 2 (two) times daily.   14 tablet   0   . ondansetron (ZOFRAN) 4 MG tablet      Take one tablet PO every 6 hours as needed for nausea   12 tablet   0   . penicillin v potassium (VEETID) 500 MG tablet   Oral   Take 1 tablet (500 mg total) by mouth 3 (three) times daily.   30 tablet   0    BP 113/65  Pulse 95  Temp(Src) 98.8 F (37.1 C) (Oral)  Resp 18  Ht 4\' 8"  (1.422 m)  Wt 200 lb (90.719 kg)  BMI 44.86 kg/m2  SpO2 100% Physical Exam  Nursing note and vitals reviewed. Constitutional: She is oriented to person, place,  and time. She appears well-developed and well-nourished.  HENT:  Head: Normocephalic and atraumatic.  Right Ear: Tympanic membrane normal.  Left Ear: Tympanic membrane normal.  Mouth/Throat: Uvula is midline and mucous membranes are normal. Posterior oropharyngeal erythema present.  Eyes: EOM are normal.  Neck: Normal range of motion. Neck supple.  Cardiovascular: Normal rate, regular rhythm and normal heart sounds.   Pulmonary/Chest: Effort normal and breath sounds normal.  Abdominal: Soft. Bowel sounds are normal. There is no tenderness.  Genitourinary:  External genitalia without lesions, frothy malodorous discharge vaginal vault. No CMT, no adnexal  tenderness, uterus without palpable enlargement.  Musculoskeletal: Normal range of motion.  Lymphadenopathy:    She has cervical adenopathy.  Neurological: She is alert and oriented to person, place, and time. No cranial nerve deficit.  Skin: Skin is warm and dry.  Psychiatric: She has a normal mood and affect. Her behavior is normal.    Results for orders placed during the hospital encounter of 03/02/13 (from the past 24 hour(s))  URINALYSIS, ROUTINE W REFLEX MICROSCOPIC     Status: Abnormal   Collection Time    03/02/13  9:27 PM      Result Value Range   Color, Urine YELLOW  YELLOW   APPearance CLOUDY (*) CLEAR   Specific Gravity, Urine 1.026  1.005 - 1.030   pH 5.5  5.0 - 8.0   Glucose, UA NEGATIVE  NEGATIVE mg/dL   Hgb urine dipstick MODERATE (*) NEGATIVE   Bilirubin Urine SMALL (*) NEGATIVE   Ketones, ur 15 (*) NEGATIVE mg/dL   Protein, ur NEGATIVE  NEGATIVE mg/dL   Urobilinogen, UA 0.2  0.0 - 1.0 mg/dL   Nitrite NEGATIVE  NEGATIVE   Leukocytes, UA LARGE (*) NEGATIVE  PREGNANCY, URINE     Status: None   Collection Time    03/02/13  9:27 PM      Result Value Range   Preg Test, Ur NEGATIVE  NEGATIVE  URINE MICROSCOPIC-ADD ON     Status: Abnormal   Collection Time    03/02/13  9:27 PM      Result Value Range   Squamous Epithelial / LPF MANY (*) RARE   WBC, UA 21-50  <3 WBC/hpf   RBC / HPF 7-10  <3 RBC/hpf   Bacteria, UA MANY (*) RARE   Urine-Other TRICHOMONAS PRESENT    RAPID STREP SCREEN     Status: Abnormal   Collection Time    03/02/13  9:32 PM      Result Value Range   Streptococcus, Group A Screen (Direct) POSITIVE (*) NEGATIVE  WET PREP, GENITAL     Status: Abnormal   Collection Time    03/02/13 11:40 PM      Result Value Range   Yeast Wet Prep HPF POC NONE SEEN  NONE SEEN   Trich, Wet Prep FEW (*) NONE SEEN   Clue Cells Wet Prep HPF POC MODERATE (*) NONE SEEN   WBC, Wet Prep HPF POC FEW (*) NONE SEEN    ED Course  Procedures   MDM  22 y.o. female  with strep pharyngitis, bacterial vaginosis and trichomonas vaginosis. Will treat with antibiotics. I have reviewed this patient's vital signs, nurses notes, appropriate labs and discussed findings with the patient and plan of care. She voices understanding. Stable for discharge without any immediate complications.    Medication List    TAKE these medications       guaiFENesin-codeine 100-10 MG/5ML syrup  Commonly known as:  ROBITUSSIN AC  Take 5 mLs by mouth 3 (three) times daily as needed for cough.     metroNIDAZOLE 500 MG tablet  Commonly known as:  FLAGYL  Take 1 tablet (500 mg total) by mouth 2 (two) times daily.     ondansetron 4 MG tablet  Commonly known as:  ZOFRAN  Take one tablet PO every 6 hours as needed for nausea     penicillin v potassium 500 MG tablet  Commonly known as:  VEETID  Take 1 tablet (500 mg total) by mouth 3 (three) times daily.      ASK your doctor about these medications       IMPLANON 68 MG Impl implant  Generic drug:  etonogestrel  Inject 1 each into the skin once. Implanted 02/17/2010           Janne Napoleon, NP 03/03/13 0009

## 2013-03-02 NOTE — ED Notes (Signed)
Pt reports cough, congestion, back pain, abdominal pain, sore throat x 3 days.  denies fever

## 2013-03-03 LAB — GC/CHLAMYDIA PROBE AMP: CT Probe RNA: NEGATIVE

## 2013-03-03 NOTE — ED Provider Notes (Signed)
Medical screening examination/treatment/procedure(s) were performed by non-physician practitioner and as supervising physician I was immediately available for consultation/collaboration.  EKG Interpretation   None         Shanna Cisco, MD 03/03/13 [phone]

## 2013-03-04 LAB — URINE CULTURE
Colony Count: NO GROWTH
Culture: NO GROWTH

## 2013-03-20 ENCOUNTER — Encounter (HOSPITAL_BASED_OUTPATIENT_CLINIC_OR_DEPARTMENT_OTHER): Payer: Self-pay | Admitting: Emergency Medicine

## 2013-03-20 ENCOUNTER — Emergency Department (HOSPITAL_BASED_OUTPATIENT_CLINIC_OR_DEPARTMENT_OTHER)
Admission: EM | Admit: 2013-03-20 | Discharge: 2013-03-20 | Disposition: A | Discharge status: Home / Self Care | Payer: BC Managed Care – PPO | Attending: Emergency Medicine | Admitting: Emergency Medicine

## 2013-03-20 DIAGNOSIS — R61 Generalized hyperhidrosis: Secondary | ICD-10-CM | POA: Insufficient documentation

## 2013-03-20 DIAGNOSIS — J020 Streptococcal pharyngitis: Secondary | ICD-10-CM | POA: Insufficient documentation

## 2013-03-20 DIAGNOSIS — R52 Pain, unspecified: Secondary | ICD-10-CM | POA: Insufficient documentation

## 2013-03-20 DIAGNOSIS — Z975 Presence of (intrauterine) contraceptive device: Secondary | ICD-10-CM | POA: Insufficient documentation

## 2013-03-20 DIAGNOSIS — R42 Dizziness and giddiness: Secondary | ICD-10-CM | POA: Insufficient documentation

## 2013-03-20 DIAGNOSIS — R109 Unspecified abdominal pain: Secondary | ICD-10-CM | POA: Insufficient documentation

## 2013-03-20 DIAGNOSIS — M549 Dorsalgia, unspecified: Secondary | ICD-10-CM | POA: Insufficient documentation

## 2013-03-20 DIAGNOSIS — F17200 Nicotine dependence, unspecified, uncomplicated: Secondary | ICD-10-CM | POA: Insufficient documentation

## 2013-03-20 DIAGNOSIS — Z792 Long term (current) use of antibiotics: Secondary | ICD-10-CM | POA: Insufficient documentation

## 2013-03-20 DIAGNOSIS — Z79899 Other long term (current) drug therapy: Secondary | ICD-10-CM | POA: Insufficient documentation

## 2013-03-20 DIAGNOSIS — R112 Nausea with vomiting, unspecified: Secondary | ICD-10-CM | POA: Insufficient documentation

## 2013-03-20 LAB — RAPID STREP SCREEN (MED CTR MEBANE ONLY): Streptococcus, Group A Screen (Direct): POSITIVE — AB

## 2013-03-20 MED ORDER — CEPHALEXIN 500 MG PO CAPS: 500.0000 mg | ORAL_CAPSULE | Freq: Four times a day (QID) | ORAL | Status: DC

## 2013-03-20 NOTE — ED Notes (Signed)
Aching all over, cough with some blood noted, chest pain, and sore throat.

## 2013-03-20 NOTE — Discharge Instructions (Signed)
Keflex as prescribed.  Ibuprofen 600 mg every 6 hours as needed for pain or fever.   Strep Throat Strep throat is an infection of the throat caused by a bacteria named Streptococcus pyogenes. Your caregiver may call the infection streptococcal "tonsillitis" or "pharyngitis" depending on whether there are signs of inflammation in the tonsils or back of the throat. Strep throat is most common in children aged 22 15 years during the cold months of the year, but it can occur in people of any age during any season. This infection is spread from person to person (contagious) through coughing, sneezing, or other close contact. SYMPTOMS   Fever or chills.  Painful, swollen, red tonsils or throat.  Pain or difficulty when swallowing.  White or yellow spots on the tonsils or throat.  Swollen, tender lymph nodes or "glands" of the neck or under the jaw.  Red rash all over the body (rare). DIAGNOSIS  Many different infections can cause the same symptoms. A test must be done to confirm the diagnosis so the right treatment can be given. A "rapid strep test" can help your caregiver make the diagnosis in a few minutes. If this test is not available, a light swab of the infected area can be used for a throat culture test. If a throat culture test is done, results are usually available in a day or two. TREATMENT  Strep throat is treated with antibiotic medicine. HOME CARE INSTRUCTIONS   Gargle with 1 tsp of salt in 1 cup of warm water, 3 4 times per day or as needed for comfort.  Family members who also have a sore throat or fever should be tested for strep throat and treated with antibiotics if they have the strep infection.  Make sure everyone in your household washes their hands well.  Do not share food, drinking cups, or personal items that could cause the infection to spread to others.  You may need to eat a soft food diet until your sore throat gets better.  Drink enough water and fluids to  keep your urine clear or pale yellow. This will help prevent dehydration.  Get plenty of rest.  Stay home from school, daycare, or work until you have been on antibiotics for 24 hours.  Only take over-the-counter or prescription medicines for pain, discomfort, or fever as directed by your caregiver.  If antibiotics are prescribed, take them as directed. Finish them even if you start to feel better. SEEK MEDICAL CARE IF:   The glands in your neck continue to enlarge.  You develop a rash, cough, or earache.  You cough up green, yellow-brown, or bloody sputum.  You have pain or discomfort not controlled by medicines.  Your problems seem to be getting worse rather than better. SEEK IMMEDIATE MEDICAL CARE IF:   You develop any new symptoms such as vomiting, severe headache, stiff or painful neck, chest pain, shortness of breath, or trouble swallowing.  You develop severe throat pain, drooling, or changes in your voice.  You develop swelling of the neck, or the skin on the neck becomes red and tender.  You have a fever.  You develop signs of dehydration, such as fatigue, dry mouth, and decreased urination.  You become increasingly sleepy, or you cannot wake up completely. Document Released: 02/18/2000 Document Revised: 02/07/2012 Document Reviewed: 04/21/2010 [facility] Patient Information 2014 [place], [place].

## 2013-03-20 NOTE — ED Provider Notes (Signed)
CSN: [id number]     Arrival date & time 03/20/13  2018 History  This chart was scribed for Geoffery Lyons, MD by Leone Payor, ED Scribe. This patient was seen in room MH12/MH12 and the patient's care was started 8:51 PM.    Chief Complaint  Patient presents with  . URI    The history is provided by the patient. No language interpreter was used.    HPI Comments: Pamela Obrien is a 23 y.o. female who presents to the Emergency Department complaining of 2 days of gradual onset, gradually worsening, constant cough and sore throat with associated abdominal pain and back pain. She reports having an episode earlier today that included dizziness, diaphoresis, nausea, vomiting. She also reports having generalized body aches. She denies diarrhea.   Past Medical History  Diagnosis Date  . Chlamydia    History reviewed. No pertinent past surgical history. No family history on file. History  Substance Use Topics  . Smoking status: Current Some Day Smoker -- 0.50 packs/day    Types: Cigarettes  . Smokeless tobacco: Not on file  . Alcohol Use: Yes     Comment: occ   OB History   Grav Para Term Preterm Abortions TAB SAB Ect Mult Living                 Review of Systems A complete 10 system review of systems was obtained and all systems are negative except as noted in the HPI and PMH.   Allergies  Review of patient's allergies indicates no known allergies.  Home Medications   Current Outpatient Rx  Name  Route  Sig  Dispense  Refill  . etonogestrel (IMPLANON) 68 MG IMPL implant   Subcutaneous   Inject 1 each into the skin once. Implanted 02/17/2010          . guaiFENesin-codeine (ROBITUSSIN AC) 100-10 MG/5ML syrup   Oral   Take 5 mLs by mouth 3 (three) times daily as needed for cough.   120 mL   0   . metroNIDAZOLE (FLAGYL) 500 MG tablet   Oral   Take 1 tablet (500 mg total) by mouth 2 (two) times daily.   14 tablet   0   . ondansetron (ZOFRAN) 4 MG tablet      Take one  tablet PO every 6 hours as needed for nausea   12 tablet   0   . penicillin v potassium (VEETID) 500 MG tablet   Oral   Take 1 tablet (500 mg total) by mouth 3 (three) times daily.   30 tablet   0    BP 147/90  Pulse 110  Temp(Src) 100.1 F (37.8 C) (Oral)  SpO2 99% Physical Exam  Nursing note and vitals reviewed. Constitutional: She is oriented to person, place, and time. She appears well-developed and well-nourished.  HENT:  Head: Normocephalic and atraumatic.  Right Ear: External ear normal.  Left Ear: External ear normal.  Mouth/Throat: Posterior oropharyngeal erythema present. No oropharyngeal exudate.  PO is mildly erythematous without exudate.   Neck: Normal range of motion. Neck supple.  Cardiovascular: Normal rate, regular rhythm and normal heart sounds.   Pulmonary/Chest: Effort normal.  Abdominal: Soft. She exhibits no distension. There is no tenderness. There is no rebound and no guarding.  Lymphadenopathy:    She has no cervical adenopathy.  Neurological: She is alert and oriented to person, place, and time.  Skin: Skin is warm and dry.  Psychiatric: She has a normal mood and affect.  ED Course  Procedures (including critical care time)  DIAGNOSTIC STUDIES: Oxygen Saturation is 99% on RA, normal by my interpretation.    COORDINATION OF CARE: 8:51 PM Discussed treatment plan with pt at bedside and pt agreed to plan.   Labs Review Labs Reviewed - No data to display Imaging Review No results found.    MDM  No diagnosis found. Patient presents with upper respiratory symptoms, sore throat, generalized body aches, and fever. Strep test reveals a positive result. We'll treat for strep pharyngitis and followup when necessary.  I personally performed the services described in this documentation, which was scribed in my presence. The recorded information has been reviewed and is accurate.      Geoffery Lyonsouglas Rutilio Yellowhair, MD 03/20/13 2152

## 2014-05-04 ENCOUNTER — Emergency Department (HOSPITAL_BASED_OUTPATIENT_CLINIC_OR_DEPARTMENT_OTHER)
Admission: EM | Admit: 2014-05-04 | Discharge: 2014-05-04 | Disposition: A | Discharge status: Home / Self Care | Payer: 59 | Attending: Emergency Medicine | Admitting: Emergency Medicine

## 2014-05-04 ENCOUNTER — Encounter (HOSPITAL_BASED_OUTPATIENT_CLINIC_OR_DEPARTMENT_OTHER): Payer: Self-pay | Admitting: Emergency Medicine

## 2014-05-04 ENCOUNTER — Emergency Department (HOSPITAL_BASED_OUTPATIENT_CLINIC_OR_DEPARTMENT_OTHER): Payer: 59

## 2014-05-04 DIAGNOSIS — J40 Bronchitis, not specified as acute or chronic: Secondary | ICD-10-CM | POA: Insufficient documentation

## 2014-05-04 DIAGNOSIS — Z8619 Personal history of other infectious and parasitic diseases: Secondary | ICD-10-CM | POA: Insufficient documentation

## 2014-05-04 DIAGNOSIS — Z720 Tobacco use: Secondary | ICD-10-CM | POA: Diagnosis not present

## 2014-05-04 DIAGNOSIS — J069 Acute upper respiratory infection, unspecified: Secondary | ICD-10-CM | POA: Insufficient documentation

## 2014-05-04 DIAGNOSIS — R0602 Shortness of breath: Secondary | ICD-10-CM | POA: Diagnosis present

## 2014-05-04 LAB — RAPID STREP SCREEN (MED CTR MEBANE ONLY): Streptococcus, Group A Screen (Direct): NEGATIVE

## 2014-05-04 MED ORDER — ALBUTEROL SULFATE HFA 108 (90 BASE) MCG/ACT IN AERS: 1.0000 | INHALATION_SPRAY | Freq: Four times a day (QID) | RESPIRATORY_TRACT | Status: DC | PRN

## 2014-05-04 MED ORDER — GUAIFENESIN 100 MG/5ML PO SOLN
5.0000 mL | Freq: Once | ORAL | Status: AC
  Administered 2014-05-04: 100 mg via ORAL
  Filled 2014-05-04: qty 5

## 2014-05-04 MED ORDER — GUAIFENESIN-DM 100-10 MG/5ML PO SYRP: 5.0000 mL | ORAL_SOLUTION | ORAL | Status: DC | PRN

## 2014-05-04 MED ORDER — PREDNISONE 20 MG PO TABS: ORAL_TABLET | ORAL | Status: DC

## 2014-05-04 MED ORDER — LORATADINE 10 MG PO TABS
10.0000 mg | ORAL_TABLET | Freq: Once | ORAL | Status: AC
  Administered 2014-05-04: 10 mg via ORAL
  Filled 2014-05-04: qty 1

## 2014-05-04 MED ORDER — BENZONATATE 100 MG PO CAPS: 100.0000 mg | ORAL_CAPSULE | Freq: Three times a day (TID) | ORAL | Status: DC

## 2014-05-04 MED ORDER — ALBUTEROL SULFATE (2.5 MG/3ML) 0.083% IN NEBU
5.0000 mg | INHALATION_SOLUTION | RESPIRATORY_TRACT | Status: AC
  Administered 2014-05-04: 5 mg via RESPIRATORY_TRACT

## 2014-05-04 MED ORDER — IPRATROPIUM BROMIDE 0.02 % IN SOLN
0.5000 mg | RESPIRATORY_TRACT | Status: AC
  Administered 2014-05-04: 0.5 mg via RESPIRATORY_TRACT

## 2014-05-04 MED ORDER — IPRATROPIUM BROMIDE 0.02 % IN SOLN
RESPIRATORY_TRACT | Status: AC
  Filled 2014-05-04: qty 2.5

## 2014-05-04 MED ORDER — ALBUTEROL SULFATE (2.5 MG/3ML) 0.083% IN NEBU
INHALATION_SOLUTION | RESPIRATORY_TRACT | Status: AC
  Administered 2014-05-04: 5 mg via RESPIRATORY_TRACT
  Filled 2014-05-04: qty 6

## 2014-05-04 MED ORDER — PREDNISONE 50 MG PO TABS
60.0000 mg | ORAL_TABLET | Freq: Once | ORAL | Status: AC
  Administered 2014-05-04: 60 mg via ORAL
  Filled 2014-05-04 (×2): qty 1

## 2014-05-04 MED ORDER — ALBUTEROL SULFATE (2.5 MG/3ML) 0.083% IN NEBU
5.0000 mg | INHALATION_SOLUTION | RESPIRATORY_TRACT | Status: AC
  Administered 2014-05-04: 5 mg via RESPIRATORY_TRACT
  Filled 2014-05-04: qty 6

## 2014-05-04 MED ORDER — ALBUTEROL SULFATE (2.5 MG/3ML) 0.083% IN NEBU
INHALATION_SOLUTION | RESPIRATORY_TRACT | Status: AC
  Filled 2014-05-04: qty 6

## 2014-05-04 NOTE — ED Provider Notes (Signed)
CSN: [id number]     Arrival date & time 05/04/14  0038 History   First MD Initiated Contact with Patient 05/04/14 0245     Chief Complaint  Patient presents with  . Shortness of Breath     (Consider location/radiation/quality/duration/timing/severity/associated sxs/prior Treatment) Patient is a 24 y.o. female presenting with shortness of breath. The history is provided by the patient.  Shortness of Breath Severity:  Moderate Onset quality:  Gradual Timing:  Constant Progression:  Unchanged Context: smoke exposure and URI   Relieved by:  Nothing Worsened by:  Nothing tried Ineffective treatments:  None tried Associated symptoms: sore throat and wheezing   Associated symptoms: no chest pain, no cough, no diaphoresis, no fever, no hemoptysis, no rash, no syncope and no swollen glands   Sore throat:    Severity:  Severe   Onset quality:  Gradual   Duration:  1 day   Timing:  Constant   Progression:  Unchanged Risk factors: tobacco use   Risk factors: no hx of PE/DVT, no prolonged immobilization and no recent surgery   Was at work this evening and sent home due to SOB and wheezing  Past Medical History  Diagnosis Date  . Chlamydia    History reviewed. No pertinent past surgical history. History reviewed. No pertinent family history. History  Substance Use Topics  . Smoking status: Current Some Day Smoker -- 0.50 packs/day    Types: Cigarettes  . Smokeless tobacco: Not on file  . Alcohol Use: Yes     Comment: occ   OB History    No data available     Review of Systems  Constitutional: Negative for fever and diaphoresis.  HENT: Positive for sore throat.   Respiratory: Positive for shortness of breath and wheezing. Negative for cough and hemoptysis.   Cardiovascular: Negative for chest pain and syncope.  Skin: Negative for rash.  All other systems reviewed and are negative.     Allergies  Review of patient's allergies indicates no known allergies.  Home  Medications   Prior to Admission medications   Medication Sig Start Date End Date Taking? Authorizing Provider  cephALEXin (KEFLEX) 500 MG capsule Take 1 capsule (500 mg total) by mouth 4 (four) times daily. 03/20/13   Geoffery Lyons, MD  etonogestrel (IMPLANON) 68 MG IMPL implant Inject 1 each into the skin once. Implanted 02/17/2010     Historical Provider, MD  guaiFENesin-codeine (ROBITUSSIN AC) 100-10 MG/5ML syrup Take 5 mLs by mouth 3 (three) times daily as needed for cough. 03/02/13   Hope Orlene Och, NP  metroNIDAZOLE (FLAGYL) 500 MG tablet Take 1 tablet (500 mg total) by mouth 2 (two) times daily. 03/02/13   Hope Orlene Och, NP  ondansetron (ZOFRAN) 4 MG tablet Take one tablet PO every 6 hours as needed for nausea 03/02/13   [facility] Orlene Och, NP  penicillin v potassium (VEETID) 500 MG tablet Take 1 tablet (500 mg total) by mouth 3 (three) times daily. 03/02/13   Hope Orlene Och, NP   BP 115/101 mmHg  Pulse 92  Temp(Src) 98.4 F (36.9 C) (Oral)  Resp 20  SpO2 99% Physical Exam  Constitutional: She is oriented to person, place, and time. She appears well-developed and well-nourished.  HENT:  Head: Normocephalic and atraumatic.  Mouth/Throat: Oropharynx is clear and moist.  Eyes: Conjunctivae and EOM are normal. Pupils are equal, round, and reactive to light.  Neck: Normal range of motion. Neck supple.  Intact phonation   Cardiovascular: Normal rate, regular rhythm  and intact distal pulses.   Pulmonary/Chest: No accessory muscle usage or stridor. No respiratory distress. She has wheezes. She has no rhonchi. She has no rales. She exhibits no tenderness.  Abdominal: Soft. Bowel sounds are normal. There is no tenderness. There is no rebound and no guarding.  Musculoskeletal: Normal range of motion.  Lymphadenopathy:    She has no cervical adenopathy.  Neurological: She is alert and oriented to person, place, and time.  Skin: Skin is warm and dry.  Psychiatric: She has a normal mood and affect.     ED Course  Procedures (including critical care time) Labs Review Labs Reviewed  RAPID STREP SCREEN  CULTURE, GROUP A STREP    Imaging Review Dg Chest 2 View  05/04/2014   CLINICAL DATA:  Shortness of breath and cough  EXAM: CHEST  2 VIEW  COMPARISON:  12/18/2013  FINDINGS: Normal heart size and mediastinal contours. Bronchial wall thickening. No acute infiltrate or edema. No effusion or pneumothorax. No acute osseous findings.  IMPRESSION: Bronchitis without pneumonia.   Electronically Signed   By: Marnee SpringJonathon  Watts M.D.   On: 05/04/2014 01:51     EKG Interpretation None      MDM   Final diagnoses:  None    Medications  albuterol (PROVENTIL) (2.5 MG/3ML) 0.083% nebulizer solution 5 mg (5 mg Nebulization Given 05/04/14 0118)  albuterol (PROVENTIL) (2.5 MG/3ML) 0.083% nebulizer solution 5 mg (5 mg Nebulization Given 05/04/14 0231)  ipratropium (ATROVENT) nebulizer solution 0.5 mg (0.5 mg Nebulization Given 05/04/14 0231)  albuterol (PROVENTIL) (2.5 MG/3ML) 0.083% nebulizer solution 5 mg (5 mg Nebulization Given 05/04/14 0326)  predniSONE (DELTASONE) tablet 60 mg (60 mg Oral Given 05/04/14 0345)  loratadine (CLARITIN) tablet 10 mg (10 mg Oral Given 05/04/14 0345)  guaiFENesin (ROBITUSSIN) 100 MG/5ML solution 100 mg (100 mg Oral Given 05/04/14 0344)   Symptoms are viral in nature given URI and wheezing and likely exacerbated by smoking.  Based on the the [facility] criteria there is no indication for further testing or treatment.   Following the breathing treatment the patient's lungs are completely clear with good chest expansion and deep breaths.  The patient is still concerned about strep and she was assured her strep test is negative.  Ms. Pamela Obrien's evaluation in the ED is complete.  She is stable for discharge at this time    Cadden Elizondo Smitty CordsK Jamill Wetmore-Rasch, MD 05/04/14 [id number]

## 2014-05-04 NOTE — ED Notes (Signed)
Pt reports sent home from work d/t SOB and cough with dizziness. Pt reports productive cough greenish yellow

## 2014-05-04 NOTE — Discharge Instructions (Signed)
How to Use an Inhaler Proper inhaler technique is very important. Good technique ensures that the medicine reaches the lungs. Poor technique results in depositing the medicine on the tongue and back of the throat rather than in the airways. If you do not use the inhaler with good technique, the medicine will not help you. STEPS TO FOLLOW IF USING AN INHALER WITHOUT AN EXTENSION TUBE 1. Remove the cap from the inhaler. 2. If you are using the inhaler for the first time, you will need to prime it. Shake the inhaler for 5 seconds and release four puffs into the air, away from your face. Ask your health care provider or pharmacist if you have questions about priming your inhaler. 3. Shake the inhaler for 5 seconds before each breath in (inhalation). 4. Position the inhaler so that the top of the canister faces up. 5. Put your index finger on the top of the medicine canister. Your thumb supports the bottom of the inhaler. 6. Open your mouth. 7. Either place the inhaler between your teeth and place your lips tightly around the mouthpiece, or hold the inhaler 1-2 inches away from your open mouth. If you are unsure of which technique to use, ask your health care provider. 8. Breathe out (exhale) normally and as completely as possible. 9. Press the canister down with your index finger to release the medicine. 10. At the same time as the canister is pressed, inhale deeply and slowly until your lungs are completely filled. This should take 4-6 seconds. Keep your tongue down. 11. Hold the medicine in your lungs for 5-10 seconds (10 seconds is best). This helps the medicine get into the small airways of your lungs. 12. Breathe out slowly, through pursed lips. Whistling is an example of pursed lips. 13. Wait at least 15-30 seconds between puffs. Continue with the above steps until you have taken the number of puffs your health care provider has ordered. Do not use the inhaler more than your health care provider  tells you. 14. Replace the cap on the inhaler. 15. Follow the directions from your health care provider or the inhaler insert for cleaning the inhaler. STEPS TO FOLLOW IF USING AN INHALER WITH AN EXTENSION (SPACER) 1. Remove the cap from the inhaler. 2. If you are using the inhaler for the first time, you will need to prime it. Shake the inhaler for 5 seconds and release four puffs into the air, away from your face. Ask your health care provider or pharmacist if you have questions about priming your inhaler. 3. Shake the inhaler for 5 seconds before each breath in (inhalation). 4. Place the open end of the spacer onto the mouthpiece of the inhaler. 5. Position the inhaler so that the top of the canister faces up and the spacer mouthpiece faces you. 6. Put your index finger on the top of the medicine canister. Your thumb supports the bottom of the inhaler and the spacer. 7. Breathe out (exhale) normally and as completely as possible. 8. Immediately after exhaling, place the spacer between your teeth and into your mouth. Close your lips tightly around the spacer. 9. Press the canister down with your index finger to release the medicine. 10. At the same time as the canister is pressed, inhale deeply and slowly until your lungs are completely filled. This should take 4-6 seconds. Keep your tongue down and out of the way. 11. Hold the medicine in your lungs for 5-10 seconds (10 seconds is best). This helps the   medicine get into the small airways of your lungs. Exhale. 12. Repeat inhaling deeply through the spacer mouthpiece. Again hold that breath for up to 10 seconds (10 seconds is best). Exhale slowly. If it is difficult to take this second deep breath through the spacer, breathe normally several times through the spacer. Remove the spacer from your mouth. 13. Wait at least 15-30 seconds between puffs. Continue with the above steps until you have taken the number of puffs your health care provider has  ordered. Do not use the inhaler more than your health care provider tells you. 14. Remove the spacer from the inhaler, and place the cap on the inhaler. 15. Follow the directions from your health care provider or the inhaler insert for cleaning the inhaler and spacer. If you are using different kinds of inhalers, use your quick relief medicine to open the airways 10-15 minutes before using a steroid if instructed to do so by your health care provider. If you are unsure which inhalers to use and the order of using them, ask your health care provider, nurse, or respiratory therapist. If you are using a steroid inhaler, always rinse your mouth with water after your last puff, then gargle and spit out the water. Do not swallow the water. AVOID:  Inhaling before or after starting the spray of medicine. It takes practice to coordinate your breathing with triggering the spray.  Inhaling through the nose (rather than the mouth) when triggering the spray. HOW TO DETERMINE IF YOUR INHALER IS FULL OR NEARLY EMPTY You cannot know when an inhaler is empty by shaking it. A few inhalers are now being made with dose counters. Ask your health care provider for a prescription that has a dose counter if you feel you need that extra help. If your inhaler does not have a counter, ask your health care provider to help you determine the date you need to refill your inhaler. Write the refill date on a calendar or your inhaler canister. Refill your inhaler 7-10 days before it runs out. Be sure to keep an adequate supply of medicine. This includes making sure it is not expired, and that you have a spare inhaler.  SEEK MEDICAL CARE IF:   Your symptoms are only partially relieved with your inhaler.  You are having trouble using your inhaler.  You have some increase in phlegm. SEEK IMMEDIATE MEDICAL CARE IF:   You feel little or no relief with your inhalers. You are still wheezing and are feeling shortness of breath or  tightness in your chest or both.  You have dizziness, headaches, or a fast heart rate.  You have chills, fever, or night sweats.  You have a noticeable increase in phlegm production, or there is blood in the phlegm. MAKE SURE YOU:   Understand these instructions.  Will watch your condition.  Will get help right away if you are not doing well or get worse. Document Released: 02/18/2000 Document Revised: 12/11/2012 Document Reviewed: 09/19/2012 [employer] Patient Information 2015 [employer]. This information is not intended to replace advice given to you by your health care provider. Make sure you discuss any questions you have with your health care provider.  

## 2014-05-06 LAB — CULTURE, GROUP A STREP: Strep A Culture: NEGATIVE

## 2014-09-14 ENCOUNTER — Emergency Department (HOSPITAL_BASED_OUTPATIENT_CLINIC_OR_DEPARTMENT_OTHER)
Admission: EM | Admit: 2014-09-14 | Discharge: 2014-09-14 | Disposition: A | Discharge status: Home / Self Care | Payer: 59 | Attending: Emergency Medicine | Admitting: Emergency Medicine

## 2014-09-14 ENCOUNTER — Encounter (HOSPITAL_BASED_OUTPATIENT_CLINIC_OR_DEPARTMENT_OTHER): Payer: Self-pay

## 2014-09-14 ENCOUNTER — Emergency Department (HOSPITAL_BASED_OUTPATIENT_CLINIC_OR_DEPARTMENT_OTHER): Payer: 59

## 2014-09-14 ENCOUNTER — Other Ambulatory Visit: Payer: Self-pay

## 2014-09-14 DIAGNOSIS — R1030 Lower abdominal pain, unspecified: Secondary | ICD-10-CM | POA: Diagnosis present

## 2014-09-14 DIAGNOSIS — Z720 Tobacco use: Secondary | ICD-10-CM | POA: Diagnosis not present

## 2014-09-14 DIAGNOSIS — O26899 Other specified pregnancy related conditions, unspecified trimester: Secondary | ICD-10-CM

## 2014-09-14 DIAGNOSIS — F410 Panic disorder [episodic paroxysmal anxiety] without agoraphobia: Secondary | ICD-10-CM | POA: Insufficient documentation

## 2014-09-14 DIAGNOSIS — R109 Unspecified abdominal pain: Secondary | ICD-10-CM

## 2014-09-14 DIAGNOSIS — N8320 Unspecified ovarian cysts: Secondary | ICD-10-CM | POA: Diagnosis not present

## 2014-09-14 DIAGNOSIS — N83202 Unspecified ovarian cyst, left side: Secondary | ICD-10-CM

## 2014-09-14 DIAGNOSIS — Z331 Pregnant state, incidental: Secondary | ICD-10-CM | POA: Insufficient documentation

## 2014-09-14 DIAGNOSIS — Z3490 Encounter for supervision of normal pregnancy, unspecified, unspecified trimester: Secondary | ICD-10-CM

## 2014-09-14 HISTORY — DX: Panic disorder (episodic paroxysmal anxiety): F41.0

## 2014-09-14 HISTORY — DX: Anxiety disorder, unspecified: F41.9

## 2014-09-14 HISTORY — DX: Bipolar disorder, unspecified: F31.9

## 2014-09-14 HISTORY — DX: Depression, unspecified: F32.A

## 2014-09-14 HISTORY — DX: Major depressive disorder, single episode, unspecified: F32.9

## 2014-09-14 LAB — CBC WITH DIFFERENTIAL/PLATELET
BASOS ABS: 0 10*3/uL (ref 0.0–0.1)
BASOS PCT: 0 % (ref 0–1)
Eosinophils Absolute: 0 10*3/uL (ref 0.0–0.7)
Eosinophils Relative: 0 % (ref 0–5)
HCT: 37.1 % (ref 36.0–46.0)
Hemoglobin: 12.7 g/dL (ref 12.0–15.0)
LYMPHS ABS: 2.4 10*3/uL (ref 0.7–4.0)
Lymphocytes Relative: 33 % (ref 12–46)
MCH: 29.3 pg (ref 26.0–34.0)
MCHC: 34.2 g/dL (ref 30.0–36.0)
MCV: 85.7 fL (ref 78.0–100.0)
Monocytes Absolute: 0.6 10*3/uL (ref 0.1–1.0)
Monocytes Relative: 8 % (ref 3–12)
NEUTROS ABS: 4.4 10*3/uL (ref 1.7–7.7)
Neutrophils Relative %: 59 % (ref 43–77)
Platelets: 258 10*3/uL (ref 150–400)
RBC: 4.33 MIL/uL (ref 3.87–5.11)
RDW: 12.5 % (ref 11.5–15.5)
WBC: 7.5 10*3/uL (ref 4.0–10.5)

## 2014-09-14 LAB — URINALYSIS, ROUTINE W REFLEX MICROSCOPIC
BILIRUBIN URINE: NEGATIVE
GLUCOSE, UA: NEGATIVE mg/dL
Hgb urine dipstick: NEGATIVE
Ketones, ur: NEGATIVE mg/dL
Leukocytes, UA: NEGATIVE
NITRITE: NEGATIVE
Protein, ur: NEGATIVE mg/dL
Specific Gravity, Urine: 1.018 (ref 1.005–1.030)
UROBILINOGEN UA: 0.2 mg/dL (ref 0.0–1.0)
pH: 6 (ref 5.0–8.0)

## 2014-09-14 LAB — COMPREHENSIVE METABOLIC PANEL
ALT: 13 U/L — AB (ref 14–54)
ANION GAP: 9 (ref 5–15)
AST: 16 U/L (ref 15–41)
Albumin: 4 g/dL (ref 3.5–5.0)
Alkaline Phosphatase: 65 U/L (ref 38–126)
BUN: 9 mg/dL (ref 6–20)
CALCIUM: 9.1 mg/dL (ref 8.9–10.3)
CO2: 21 mmol/L — ABNORMAL LOW (ref 22–32)
Chloride: 107 mmol/L (ref 101–111)
Creatinine, Ser: 0.81 mg/dL (ref 0.44–1.00)
Glucose, Bld: 88 mg/dL (ref 65–99)
POTASSIUM: 3.6 mmol/L (ref 3.5–5.1)
Sodium: 137 mmol/L (ref 135–145)
Total Bilirubin: 0.6 mg/dL (ref 0.3–1.2)
Total Protein: 7.3 g/dL (ref 6.5–8.1)

## 2014-09-14 LAB — WET PREP, GENITAL: Trich, Wet Prep: NONE SEEN

## 2014-09-14 LAB — HCG, QUANTITATIVE, PREGNANCY: hCG, Beta Chain, Quant, S: 3650 m[IU]/mL — ABNORMAL HIGH (ref <5)

## 2014-09-14 LAB — ABO/RH: ABO/RH(D): O POS

## 2014-09-14 LAB — PREGNANCY, URINE: Preg Test, Ur: POSITIVE — AB

## 2014-09-14 NOTE — ED Notes (Signed)
Pt reports she has stressors the past 2 months related to being unemployed.  States she has been experiencing more frequent panic attacks and had a "really bad" one last night while cooking.  States she cannot sleep and awakens frequently during the night resulting in fatigue and inability to do everything she needs to do during the day.  Previous hx of bipolar and hasn't taken medication is a long time.  Previous panic attacks have been manageable in the past. Reports generalized pain associated this time with panic attacks.

## 2014-09-14 NOTE — ED Notes (Addendum)
C/o "panic attack" x 2 in the last 2 weeks-also c/o abd pain, lower back pain and chest pain x 2 weeks-hx of depression/bipolar-[facility]-pt with fast paced speech-no resp distress

## 2014-09-14 NOTE — ED Notes (Signed)
Patient transported to Ultrasound 

## 2014-09-14 NOTE — ED Provider Notes (Signed)
Patient is a 24 year old female who presents to the emergency department with a complaint of abdominal pain and possible panic attack. I received the patient for completing her care while waiting for abdominal/pelvic ultrasound.  The vital signs are well within normal limits. The components of metabolic panel shows the CO2 to be slightly low at 21, the alkaline phosphatase slightly low at 13, otherwise within normal limits. The complete blood count is well within normal limits. The quantitative hCG is 3650. The patient is blood type O+, not a candidate for Rh immunoglobulin. Urinalysis is well within normal limits. Transvaginal ultrasound shows a poorly defined hypoechoic area in the fundal portion of the endometrial canal which could represent a gestational sac. But the area is suspicious for possible failed pregnancy. Radiology suggests follow-up with additional ultrasound in 10-14 days. There is also donated a degenerating corpus luteum cysts of the left ovary.  I have discussed the findings of the patient in terms which he understands. I have asked her to have a repeat ultrasound by her GYN specialist in 10-14 days. Because of abnormality noted on the ultrasound. Questions were answered as best possible. The patient states that she will call the family GYN specialist on tomorrow, and set up an appointment for 10-14 days. I've invited the patient to return to the emergency department if any changes, problems, or concerns. The patient denies any suicidal or homicidal ideations at discharge.   Ivery QualeHobson Eliannah Hinde, PA-C 09/14/14 1921  Geoffery Lyonsouglas Delo, MD 09/15/14 1630

## 2014-09-14 NOTE — Discharge Instructions (Signed)
Is important that you are reexamine by your GYN physician in the next 10-14 days to evaluate the changes noted on your ultrasound exam. Abdominal Pain During Pregnancy Abdominal pain is common in pregnancy. Most of the time, it does not cause harm. There are many causes of abdominal pain. Some causes are more serious than others. Some of the causes of abdominal pain in pregnancy are easily diagnosed. Occasionally, the diagnosis takes time to understand. Other times, the cause is not determined. Abdominal pain can be a sign that something is very wrong with the pregnancy, or the pain may have nothing to do with the pregnancy at all. For this reason, always tell your health care provider if you have any abdominal discomfort. HOME CARE INSTRUCTIONS  Monitor your abdominal pain for any changes. The following actions may help to alleviate any discomfort you are experiencing:  Do not have sexual intercourse or put anything in your vagina until your symptoms go away completely.  Get plenty of rest until your pain improves.  Drink clear fluids if you feel nauseous. Avoid solid food as long as you are uncomfortable or nauseous.  Only take over-the-counter or prescription medicine as directed by your health care provider.  Keep all follow-up appointments with your health care provider. SEEK IMMEDIATE MEDICAL CARE IF:  You are bleeding, leaking fluid, or passing tissue from the vagina.  You have increasing pain or cramping.  You have persistent vomiting.  You have painful or bloody urination.  You have a fever.  You notice a decrease in your baby's movements.  You have extreme weakness or feel faint.  You have shortness of breath, with or without abdominal pain.  You develop a severe headache with abdominal pain.  You have abnormal vaginal discharge with abdominal pain.  You have persistent diarrhea.  You have abdominal pain that continues even after rest, or gets worse. MAKE SURE YOU:     Understand these instructions.  Will watch your condition.  Will get help right away if you are not doing well or get worse. Document Released: 02/20/2005 Document Revised: 12/11/2012 Document Reviewed: 09/19/2012 [facility] Patient Information 2015 [place], [place]. This information is not intended to replace advice given to you by your health care provider. Make sure you discuss any questions you have with your health care provider.

## 2014-09-14 NOTE — ED Provider Notes (Signed)
CSN: [id number]     Arrival date & time 09/14/14  1437 History   First MD Initiated Contact with Patient 09/14/14 1518     Chief Complaint  Patient presents with  . Panic Attack   Pamela Obrien is a 24 y.o. female with a history of depression and bipolar disorder who presents to the emergency department complaining of 2 weeks of difficulty sleeping at night, intermittent panic attacks, lower abdominal pain and low back pain for the past 2 weeks. Patient reports she has been anxious as she is out of work. She reports she had 2 panic attacks last night. She also reports she has been having 2 weeks of bilateral lower abdominal pain, pelvic pain and bilateral low back pain. She complains of 10/10 suprapubic abdominal pain.  She reports that she typically uses Implanon for birth control and was having vaginal bleeding intermittently since April until this June. She reports her Implanon was removed last month. She reports in 2 weeks ago she started having lower abdominal pain but no vaginal bleeding or discharge. She reports she's also had positional lightheadedness for the past 2 weeks. Patient is sexually active and does not use protection. He is unsure of her blood type. She is G2 P0020. The patient denies fevers, chills, suicidal ideations, homicidal ideations, visual hallucinations, auditory hallucinations, urinary symptoms, cough, hemoptysis, wheezing, chest pain, shortness of breath, or rashes.  (Consider location/radiation/quality/duration/timing/severity/associated sxs/prior Treatment) HPI  Past Medical History  Diagnosis Date  . Chlamydia   . Panic attack   . Anxiety   . Depression   . Bipolar disorder    History reviewed. No pertinent past surgical history. No family history on file. History  Substance Use Topics  . Smoking status: Current Some Day Smoker -- 0.50 packs/day    Types: Cigarettes  . Smokeless tobacco: Not on file  . Alcohol Use: Yes     Comment: occ   OB History     No data available     Review of Systems  Constitutional: Negative for fever and chills.  HENT: Negative for congestion and sore throat.   Eyes: Negative for visual disturbance.  Respiratory: Negative for cough, shortness of breath and wheezing.   Cardiovascular: Negative for chest pain and palpitations.  Gastrointestinal: Positive for abdominal pain. Negative for nausea, vomiting, diarrhea and blood in stool.  Genitourinary: Positive for vaginal pain, menstrual problem and pelvic pain. Negative for dysuria, urgency, frequency, hematuria, flank pain, vaginal bleeding, vaginal discharge and difficulty urinating.  Musculoskeletal: Negative for back pain and neck pain.  Skin: Negative for rash.  Neurological: Negative for headaches.  Psychiatric/Behavioral: Positive for dysphoric mood. Negative for suicidal ideas and hallucinations. The patient is nervous/anxious. The patient is not hyperactive.       Allergies  Review of patient's allergies indicates no known allergies.  Home Medications   Prior to Admission medications   Not on File   BP 122/71 mmHg  Pulse 61  Temp(Src) 98.5 F (36.9 C) (Oral)  Resp 16  Ht 4\' 9"  (1.448 m)  Wt 182 lb (82.555 kg)  BMI 39.37 kg/m2  SpO2 100%  LMP 06/13/2014 Physical Exam  Constitutional: She is oriented to person, place, and time. She appears well-developed and well-nourished. No distress.  Nontoxic appearing.  HENT:  Head: Normocephalic and atraumatic.  Mouth/Throat: Oropharynx is clear and moist.  Eyes: Conjunctivae are normal. Pupils are equal, round, and reactive to light. Right eye exhibits no discharge. Left eye exhibits no discharge.  Neck: Neck supple.  Cardiovascular: Normal rate, regular rhythm, normal heart sounds and intact distal pulses.  Exam reveals no gallop and no friction rub.   No murmur heard. Pulmonary/Chest: Effort normal and breath sounds normal. No respiratory distress. She has no wheezes. She has no rales.   Abdominal: Soft. Bowel sounds are normal. She exhibits no distension and no mass. There is tenderness. There is no rebound and no guarding.  Abdomen is soft. Bowel sounds are present. Patient has generalized abdominal tenderness to palpation which seems worse in her bilateral lower abdomen. No rebound tenderness. Negative Rovsing sign.  Genitourinary:  Pelvic exam preformed by me with female nurse tachycardia chaperone. No external lesions noted. No vaginal bleeding. Cervix is closed. No cervical motion tenderness. Patient has bilateral adnexal and suprapubic tenderness to palpation. Mild amount of white vaginal discharge.  Musculoskeletal: She exhibits no edema or tenderness.  No lower extremity edema or tenderness. Patient is spontaneously moving all extremities in a coordinated fashion exhibiting good strength.   Lymphadenopathy:    She has no cervical adenopathy.  Neurological: She is alert and oriented to person, place, and time. Coordination normal.  Sensation is intact her bilateral upper and lower extremities.  Skin: Skin is warm and dry. No rash noted. She is not diaphoretic. No erythema. No pallor.  Psychiatric: She has a normal mood and affect. Her behavior is normal.  Nursing note and vitals reviewed.   ED Course  Procedures (including critical care time) Labs Review Labs Reviewed  WET PREP, GENITAL - Abnormal; Notable for the following:    Yeast Wet Prep HPF POC FEW (*)    Clue Cells Wet Prep HPF POC MODERATE (*)    WBC, Wet Prep HPF POC MODERATE (*)    All other components within normal limits  PREGNANCY, URINE - Abnormal; Notable for the following:    Preg Test, Ur POSITIVE (*)    All other components within normal limits  COMPREHENSIVE METABOLIC PANEL - Abnormal; Notable for the following:    CO2 21 (*)    ALT 13 (*)    All other components within normal limits  HCG, QUANTITATIVE, PREGNANCY - Abnormal; Notable for the following:    hCG, Beta Chain, Quant, S 3650 (*)     All other components within normal limits  URINALYSIS, ROUTINE W REFLEX MICROSCOPIC (NOT AT [facility])  CBC WITH DIFFERENTIAL/PLATELET  HIV ANTIBODY (ROUTINE TESTING)  RPR  ABO/RH  GC/CHLAMYDIA PROBE AMP ([place]) NOT AT [facility]    Imaging Review No results found.   EKG Interpretation None      Filed Vitals:   09/14/14 1448 09/14/14 1648  BP: 123/83 122/71  Pulse: 90 61  Temp: 98.5 F (36.9 C)   TempSrc: Oral   Resp: 20 16  Height:  (1.448 m)   Weight: 182 lb (82.555 kg)   SpO2: 100% 100%     MDM   Final diagnoses:  Pregnancy  Lower abdominal pain   This is a 24 y.o. female with a history of depression and bipolar disorder who presents to the emergency department complaining of 2 weeks of difficulty sleeping at night, intermittent panic attacks, lower abdominal pain and low back pain for the past 2 weeks. Patient reports she has been anxious as she is out of work. She reports she had 2 panic attacks last night. She also reports she has been having 2 weeks of bilateral lower abdominal pain, pelvic pain and bilateral low back pain. She complains of 10/10 suprapubic abdominal pain.  She reports that she typically uses Implanon for birth control and was having vaginal bleeding intermittently since April until this June. She reports her Implanon was removed last month. She reports in 2 weeks ago she started having lower abdominal pain but no vaginal bleeding or discharge. She denies suicidal or homicidal ideations. On exam patient is afebrile and nontoxic appearing. The patient's abdomen is generally tender to palpation which is worse in her bilateral lower abdomen. No peritoneal signs. On pelvic exam the patient has no cervical motion tenderness and her cervix is closed. There is no vaginal bleeding. She does have bilateral adnexal tenderness and suprapubic tenderness to palpation. Wet prep returned with moderate clue cells and moderate white blood cells. Her quantitative  pregnancy is 3650. Plan is for ultrasound to rule out ectopic pregnancy. At shift change the patient is awaiting ultrasound. Patient care handed off to Ivery QualeHobson Bryant PA-C who will disposition the patient.   Everlene FarrierWilliam Bryce Kimble, PA-C 09/14/14 1816  Geoffery Lyonsouglas Delo, MD 09/15/14 1630

## 2014-09-15 LAB — RPR: RPR Ser Ql: NONREACTIVE

## 2014-09-15 LAB — GC/CHLAMYDIA PROBE AMP (~~LOC~~) NOT AT ARMC
Chlamydia: NEGATIVE
Neisseria Gonorrhea: NEGATIVE

## 2014-09-15 LAB — HIV ANTIBODY (ROUTINE TESTING W REFLEX): HIV Screen 4th Generation wRfx: NONREACTIVE

## 2015-02-21 ENCOUNTER — Encounter (HOSPITAL_BASED_OUTPATIENT_CLINIC_OR_DEPARTMENT_OTHER): Payer: Self-pay | Admitting: Emergency Medicine

## 2015-02-21 ENCOUNTER — Emergency Department (HOSPITAL_BASED_OUTPATIENT_CLINIC_OR_DEPARTMENT_OTHER)
Admission: EM | Admit: 2015-02-21 | Discharge: 2015-02-22 | Disposition: A | Discharge status: Home / Self Care | Payer: Medicaid Other | Attending: Emergency Medicine | Admitting: Emergency Medicine

## 2015-02-21 DIAGNOSIS — F17210 Nicotine dependence, cigarettes, uncomplicated: Secondary | ICD-10-CM | POA: Diagnosis not present

## 2015-02-21 DIAGNOSIS — Z8619 Personal history of other infectious and parasitic diseases: Secondary | ICD-10-CM | POA: Diagnosis not present

## 2015-02-21 DIAGNOSIS — O99332 Smoking (tobacco) complicating pregnancy, second trimester: Secondary | ICD-10-CM | POA: Insufficient documentation

## 2015-02-21 DIAGNOSIS — Z8659 Personal history of other mental and behavioral disorders: Secondary | ICD-10-CM | POA: Insufficient documentation

## 2015-02-21 DIAGNOSIS — O26899 Other specified pregnancy related conditions, unspecified trimester: Secondary | ICD-10-CM

## 2015-02-21 DIAGNOSIS — Z3A27 27 weeks gestation of pregnancy: Secondary | ICD-10-CM | POA: Insufficient documentation

## 2015-02-21 DIAGNOSIS — M545 Low back pain: Secondary | ICD-10-CM | POA: Insufficient documentation

## 2015-02-21 DIAGNOSIS — O4692 Antepartum hemorrhage, unspecified, second trimester: Secondary | ICD-10-CM | POA: Insufficient documentation

## 2015-02-21 DIAGNOSIS — O9989 Other specified diseases and conditions complicating pregnancy, childbirth and the puerperium: Secondary | ICD-10-CM | POA: Diagnosis not present

## 2015-02-21 DIAGNOSIS — R109 Unspecified abdominal pain: Secondary | ICD-10-CM

## 2015-02-21 DIAGNOSIS — R1030 Lower abdominal pain, unspecified: Secondary | ICD-10-CM | POA: Diagnosis not present

## 2015-02-21 LAB — URINALYSIS, ROUTINE W REFLEX MICROSCOPIC
Bilirubin Urine: NEGATIVE
Glucose, UA: NEGATIVE mg/dL
Hgb urine dipstick: NEGATIVE
KETONES UR: NEGATIVE mg/dL
NITRITE: NEGATIVE
PH: 7.5 (ref 5.0–8.0)
PROTEIN: NEGATIVE mg/dL
Specific Gravity, Urine: 1.009 (ref 1.005–1.030)

## 2015-02-21 LAB — URINE MICROSCOPIC-ADD ON

## 2015-02-21 NOTE — ED Notes (Signed)
Patient reports that in 2 days she is [redacted] weeks pregnant. The patient states that she left work because she was having a lot of pressure on her right back and having some spotting. This is first baby and feels like she may be having contractions

## 2015-02-21 NOTE — Progress Notes (Signed)
Pt is a G2P0, c/o back pain and spotting. FHT 140bpm, 10x10 acceleratons, occ mild variables, good variability. No contractions on monitoring (45 minutes).

## 2015-02-21 NOTE — ED Provider Notes (Signed)
CSN: [id number]     Arrival date & time 02/21/15  2208 History   First MD Initiated Contact with Patient 02/21/15 2215     Chief Complaint  Patient presents with  . Back Pain in Pregnancy      (Consider location/radiation/quality/duration/timing/severity/associated sxs/prior Treatment) Patient is a 24 y.o. female presenting with back pain. The history is provided by the patient.  Back Pain Location:  Lumbar spine Quality:  Aching Radiates to:  Does not radiate Pain severity:  Moderate Timing:  Intermittent Associated symptoms: abdominal pain    Pamela Obrien is a 24 y.o. G1P0 @ [redacted]w[redacted]d gestation who presents to the ED with low back pain and lower abdominal pressure with spotting that started tonight while at work. Patient states she drank coffee today and soda with caffeine.   She is getting prenatal care with an OB group in [facility]. She states that she began feeling like she was having contractions and then noted the spotting.   Past Medical History  Diagnosis Date  . Chlamydia   . Panic attack   . Anxiety   . Depression   . Bipolar disorder (HCC)    History reviewed. No pertinent past surgical history. History reviewed. No pertinent family history. Social History  Substance Use Topics  . Smoking status: Current Some Day Smoker -- 0.50 packs/day    Types: Cigarettes  . Smokeless tobacco: None  . Alcohol Use: Yes     Comment: occ   OB History    Gravida Para Term Preterm AB TAB SAB Ectopic Multiple Living   1              Review of Systems  Gastrointestinal: Positive for abdominal pain.  Genitourinary: Positive for vaginal bleeding.  Musculoskeletal: Positive for back pain.  all other systems negative    Allergies  Review of patient's allergies indicates no known allergies.  Home Medications   Prior to Admission medications   Not on File   BP 105/68 mmHg  Pulse 63  Temp(Src) 97.7 F (36.5 C) (Oral)  Resp 18  Ht 5' (1.524 m)  Wt 92.08 kg  BMI 39.65  kg/m2  SpO2 100%  LMP 06/13/2014 Physical Exam  Constitutional: She is oriented to person, place, and time. She appears well-developed and well-nourished. No distress.  HENT:  Head: Normocephalic.  Eyes: EOM are normal.  Neck: Neck supple.  Cardiovascular: Normal rate.   Pulmonary/Chest: Effort normal.  Abdominal: Soft.  Mildly tender lower abdomen.   Genitourinary:  External genitalia without lesions, no blood noted in vaginal vault, no pooling, there is white d/c. Cervix, thick, closed, high. Uterus consistent with dates.   Musculoskeletal: Normal range of motion.  Neurological: She is alert and oriented to person, place, and time. No cranial nerve deficit.  Skin: Skin is warm and dry.  Psychiatric: She has a normal mood and affect. Her behavior is normal.  Nursing note and vitals reviewed.   ED Course  Procedures (including critical care time) Discussed fetal monitor tracing with rapid OB response RN, FHR 140, 10x10 excels, no contractions. Reassuring tracing for gestational age.   Labs Review Labs Reviewed  URINALYSIS, ROUTINE W REFLEX MICROSCOPIC (NOT AT [facility]) - Abnormal; Notable for the following:    Leukocytes, UA LARGE (*)    All other components within normal limits  URINE MICROSCOPIC-ADD ON - Abnormal; Notable for the following:    Squamous Epithelial / LPF 0-5 (*)    Bacteria, UA FEW (*)    All other  components within normal limits  URINE CULTURE   [facility] Despina HiddenEure[facility]   MDM  24 y.o. female @ 3619w2d gestation with lower abdominal pain that radiates to the lower back. Stable for d/c without acute abdomen. Feels better after PO hydration and tylenol.  She will follow up with her OB later today.   Final diagnoses:  Abdominal pain in pregnancy       [facility] Daeton Kluth, NP 02/22/15 0120  Rolan BuccoMelanie Belfi, MD 02/25/15 [phone]

## 2015-02-22 MED ORDER — ACETAMINOPHEN 325 MG PO TABS
650.0000 mg | ORAL_TABLET | Freq: Once | ORAL | Status: AC
  Administered 2015-02-22: 650 mg via ORAL
  Filled 2015-02-22: qty 2

## 2015-02-22 NOTE — Discharge Instructions (Signed)
Be sure your are drinking 8 glasses of water per day, take tylenol for pain and see your doctor for follow up today.

## 2015-02-22 NOTE — Progress Notes (Signed)
Requested monitor adjustment. 

## 2015-02-24 LAB — URINE CULTURE

## 2015-02-25 ENCOUNTER — Telehealth (HOSPITAL_BASED_OUTPATIENT_CLINIC_OR_DEPARTMENT_OTHER): Payer: Self-pay | Admitting: Emergency Medicine

## 2015-02-25 NOTE — Telephone Encounter (Signed)
Post ED Visit - Positive Culture Follow-up  Culture report reviewed by antimicrobial stewardship pharmacist:  []  Enzo BiNathan Batchelder, Pharm.D. []  Celedonio MiyamotoJeremy Frens, [address]Ph[address]D., BCPS []  Garvin FilaMike Maccia, Pharm.D. [x]  Georgina PillionElizabeth Martin, Pharm.D., BCPS []  KlawockMinh Pham, [place]arm[place]D., BCPS, AAHIVP []  Estella HuskMichelle Turner, Pharm.D., BCPS, AAHIVP []  Tennis Mustassie Stewart, Pharm.D. []  Sherle Poeob Vincent, [address]Ph[address]  Positive urine culture multiples species present , suggest recollection, and no further patient follow-up is required at this time. To followup with OB GYN for recollect  Berle MullMiller, Jillianna Stanek 02/25/2015, 10:30 AM

## 2015-12-27 ENCOUNTER — Encounter (HOSPITAL_COMMUNITY): Payer: Self-pay

## 2016-11-12 IMAGING — US US OB COMP LESS 14 WK
2 series · 13 of 28 positions shown · non-contrast
Comparison: No priors.

CLINICAL DATA: 24-year-old pregnant female with history of pelvic
pain for the past 3 weeks.

EXAM:
OBSTETRIC <14 WK US AND TRANSVAGINAL OB US
TECHNIQUE: Both transabdominal and transvaginal ultrasound examinations were
performed for complete evaluation of the gestation as well as the
maternal uterus, adnexal regions, and pelvic cul-de-sac.
Transvaginal technique was performed to assess early pregnancy.

[Series 1: us ob comp less 14 wk · 0.18mm/px · 3 of 9 slices shown (1 of 2)]
[im 2/9]
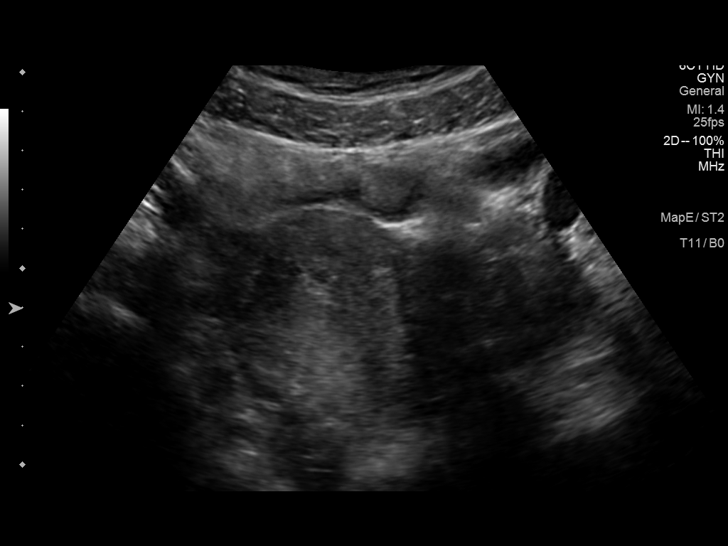
[im 5/9]
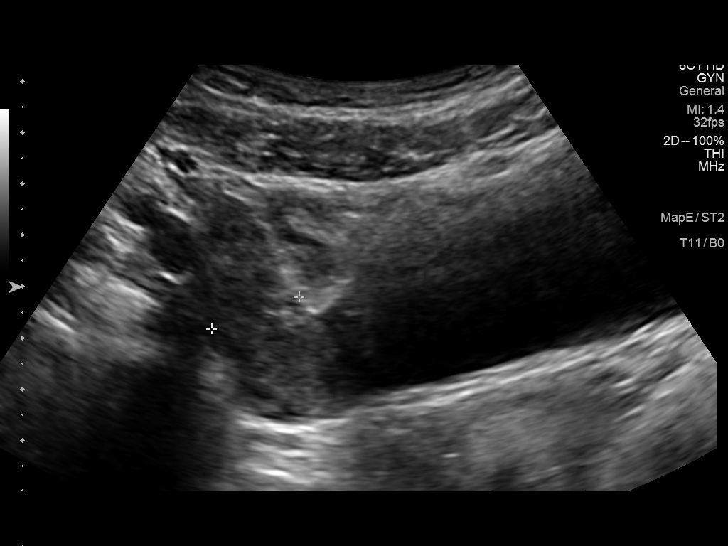
[im 7/9]
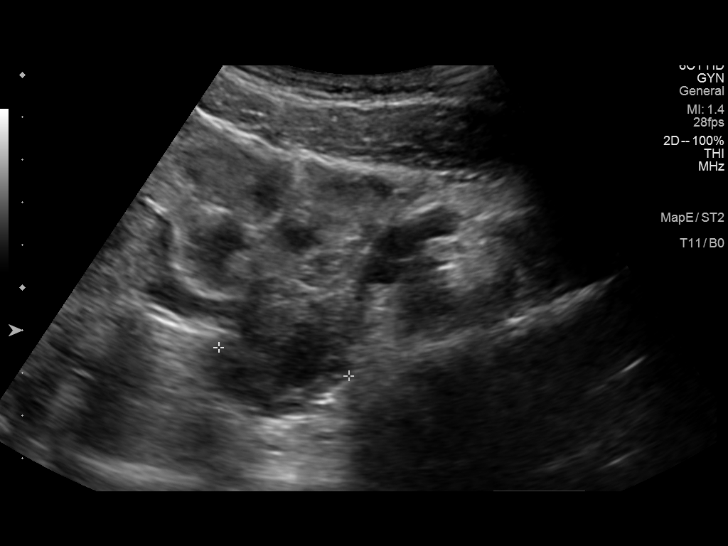

[Series 2: us ob comp less 14 wk · 0.12mm/px · 10 of 29 slices shown (2 of 2)]
[im 1/29]
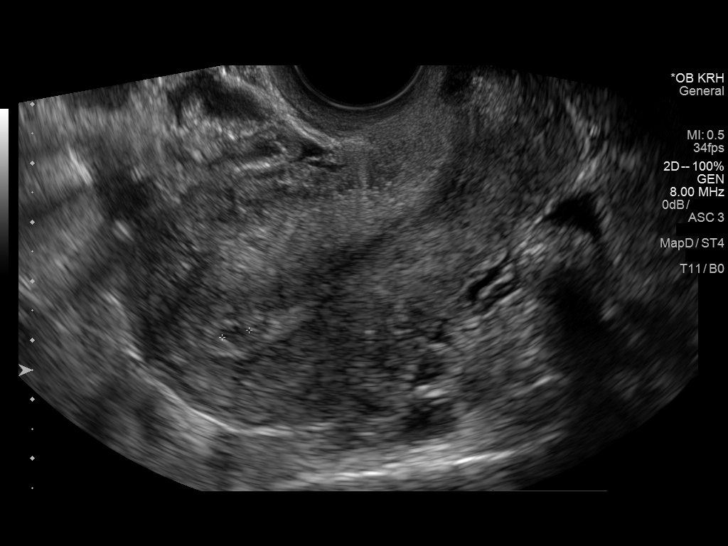
[im 3/29]
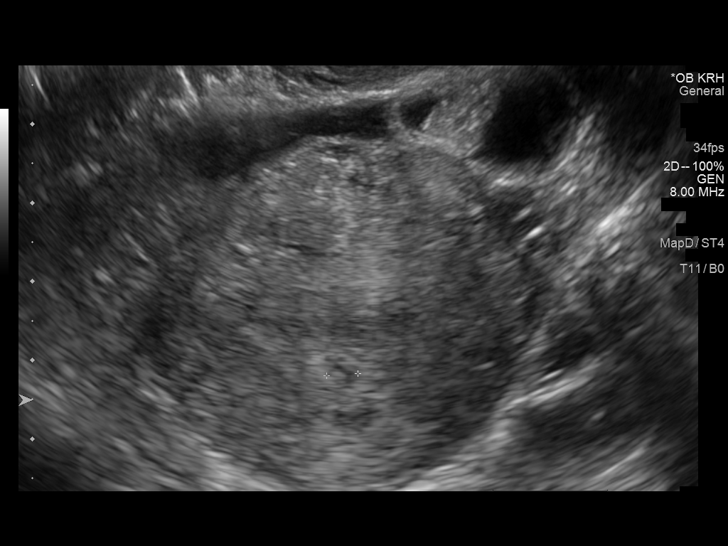
[im 6/29]
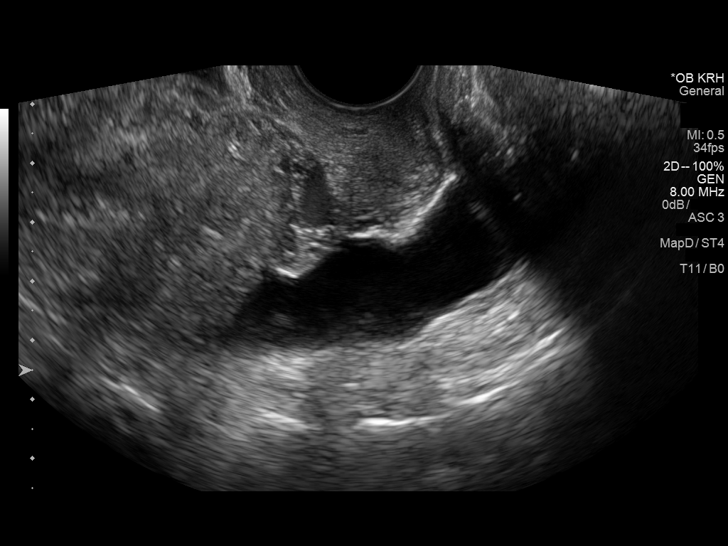
[im 10/29]
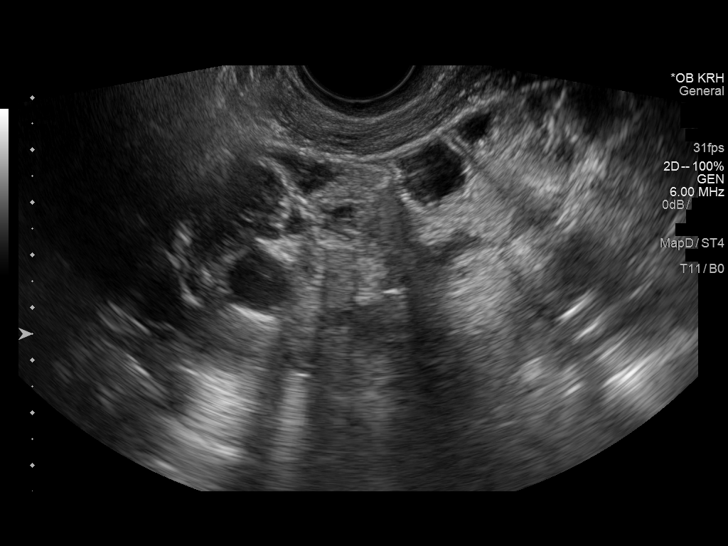
[im 13/29]
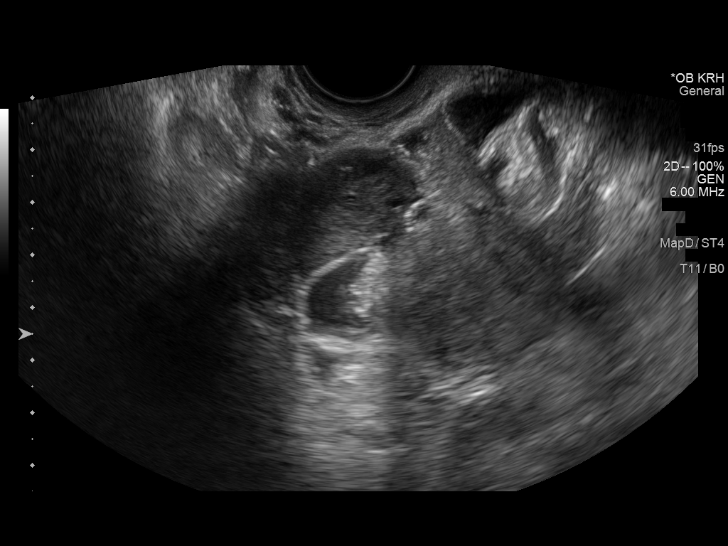
[im 16/29]
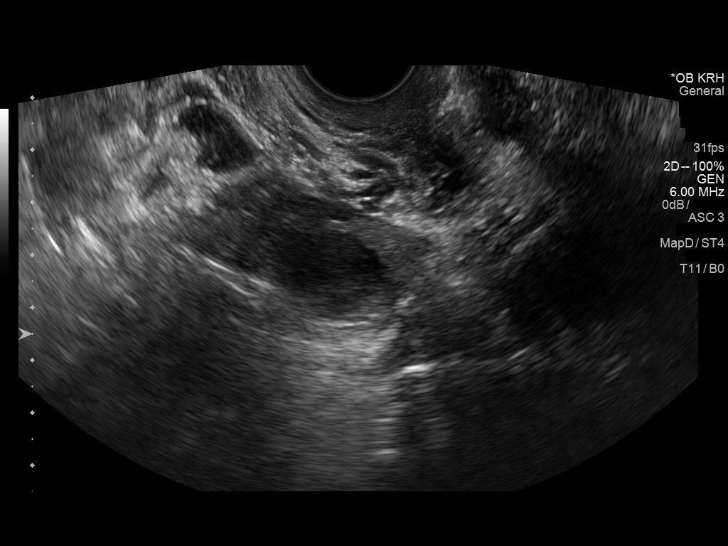
[im 19/29]
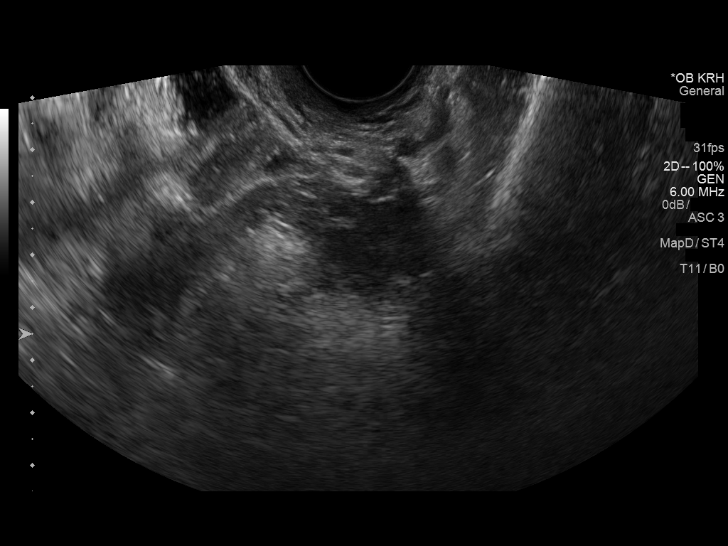
[im 22/29]
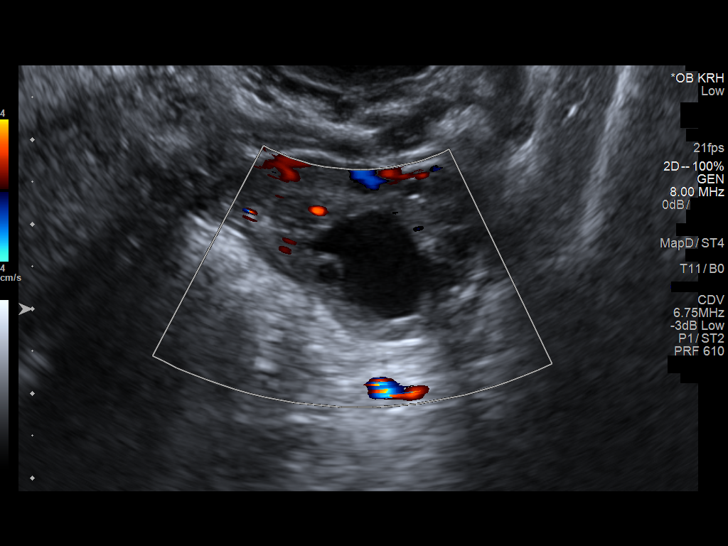
[im 24/29]
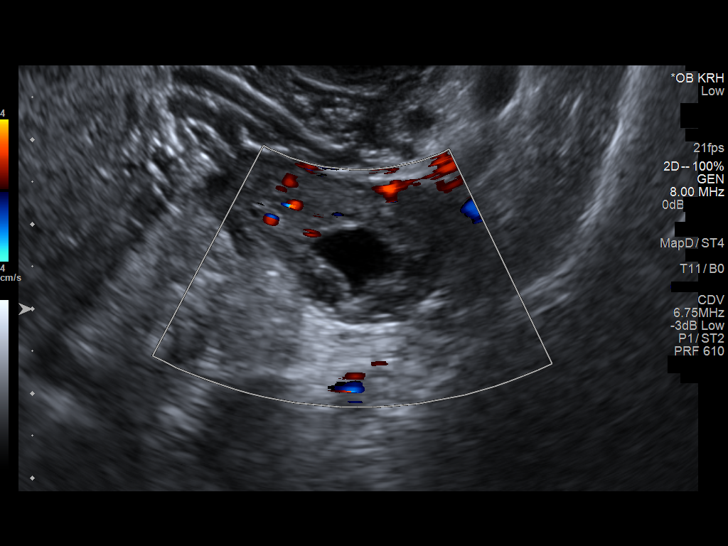
[im 27/29]
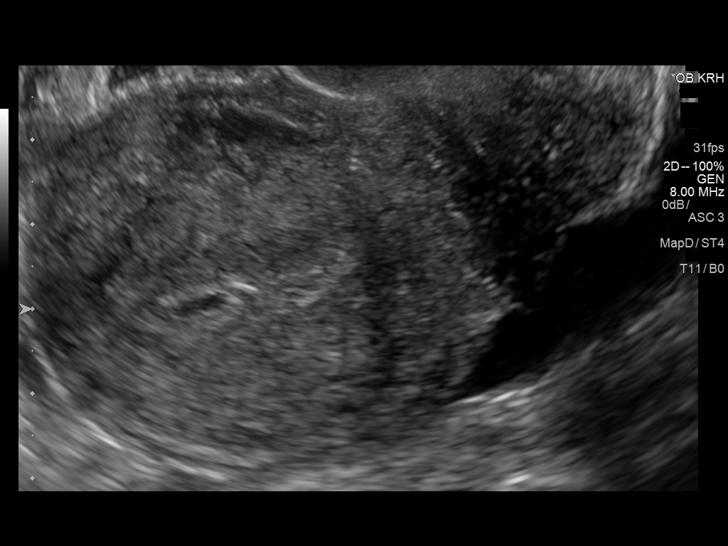

[13 of 28 positions shown; findings below may reference images not displayed]

FINDINGS: Intrauterine gestational sac: Poorly defined ovoid shaped hypoechoic
area in the fundal portion of the endometrial cavity may represent a
gestational sac.

Yolk sac:  None.

Embryo:  None.

Cardiac Activity: None.

Heart Rate: N/A

MSD: 4  mm   5 w   1 d   US EDC: 05/16/2015.

Maternal uterus/adnexae: No evidence of subchorionic hemorrhage.
Bilateral ovaries are normal in appearance. Degenerating corpus
luteum cyst in the left ovary. Small to moderate volume of free
fluid in the cul-de-sac.
IMPRESSION: 1. There is a poorly defined hypoechoic area in the fundal portion
of the endometrial canal which could represent a gestational sac,
however, this appears misshapen and more echogenic than normal.
Findings are suspicious but not yet definitive for failed pregnancy.
Recommend follow-up US in 10-14 days for definitive diagnosis. This
recommendation follows SRU consensus guidelines: Diagnostic Criteria
for Nonviable Pregnancy Early in the First Trimester. N Engl J Med
0539; [DATE].
2. Small to moderate volume of free fluid in the cul-de-sac,
presumably physiologic
3. Degenerating corpus luteum cyst in the left ovary.

## 2017-12-05 ENCOUNTER — Encounter (HOSPITAL_BASED_OUTPATIENT_CLINIC_OR_DEPARTMENT_OTHER): Payer: Self-pay

## 2017-12-05 ENCOUNTER — Other Ambulatory Visit: Payer: Self-pay

## 2017-12-05 ENCOUNTER — Emergency Department (HOSPITAL_BASED_OUTPATIENT_CLINIC_OR_DEPARTMENT_OTHER)
Admission: EM | Admit: 2017-12-05 | Discharge: 2017-12-05 | Disposition: A | Discharge status: Home / Self Care | Payer: Self-pay | Attending: Emergency Medicine | Admitting: Emergency Medicine

## 2017-12-05 DIAGNOSIS — Z87891 Personal history of nicotine dependence: Secondary | ICD-10-CM | POA: Insufficient documentation

## 2017-12-05 DIAGNOSIS — S39011A Strain of muscle, fascia and tendon of abdomen, initial encounter: Secondary | ICD-10-CM | POA: Insufficient documentation

## 2017-12-05 DIAGNOSIS — R112 Nausea with vomiting, unspecified: Secondary | ICD-10-CM | POA: Insufficient documentation

## 2017-12-05 DIAGNOSIS — Y92003 Bedroom of unspecified non-institutional (private) residence as the place of occurrence of the external cause: Secondary | ICD-10-CM | POA: Insufficient documentation

## 2017-12-05 DIAGNOSIS — Y9389 Activity, other specified: Secondary | ICD-10-CM | POA: Insufficient documentation

## 2017-12-05 DIAGNOSIS — R109 Unspecified abdominal pain: Secondary | ICD-10-CM

## 2017-12-05 DIAGNOSIS — Y999 Unspecified external cause status: Secondary | ICD-10-CM | POA: Insufficient documentation

## 2017-12-05 DIAGNOSIS — T148XXA Other injury of unspecified body region, initial encounter: Secondary | ICD-10-CM

## 2017-12-05 DIAGNOSIS — X58XXXA Exposure to other specified factors, initial encounter: Secondary | ICD-10-CM | POA: Insufficient documentation

## 2017-12-05 LAB — CBC WITH DIFFERENTIAL/PLATELET
BASOS ABS: 0 10*3/uL (ref 0.0–0.1)
BASOS PCT: 0 %
Eosinophils Absolute: 0.1 10*3/uL (ref 0.0–0.7)
Eosinophils Relative: 1 %
HCT: 38.5 % (ref 36.0–46.0)
Hemoglobin: 13.2 g/dL (ref 12.0–15.0)
Lymphocytes Relative: 37 %
Lymphs Abs: 2.7 10*3/uL (ref 0.7–4.0)
MCH: 29.6 pg (ref 26.0–34.0)
MCHC: 34.3 g/dL (ref 30.0–36.0)
MCV: 86.3 fL (ref 78.0–100.0)
MONO ABS: 0.6 10*3/uL (ref 0.1–1.0)
Monocytes Relative: 9 %
NEUTROS ABS: 3.8 10*3/uL (ref 1.7–7.7)
Neutrophils Relative %: 53 %
Platelets: 238 10*3/uL (ref 150–400)
RBC: 4.46 MIL/uL (ref 3.87–5.11)
RDW: 12.7 % (ref 11.5–15.5)
WBC: 7.2 10*3/uL (ref 4.0–10.5)

## 2017-12-05 LAB — COMPREHENSIVE METABOLIC PANEL
ALBUMIN: 3.7 g/dL (ref 3.5–5.0)
ALT: 14 U/L (ref 0–44)
AST: 19 U/L (ref 15–41)
Alkaline Phosphatase: 81 U/L (ref 38–126)
Anion gap: 7 (ref 5–15)
BUN: 13 mg/dL (ref 6–20)
CO2: 23 mmol/L (ref 22–32)
Calcium: 9 mg/dL (ref 8.9–10.3)
Chloride: 107 mmol/L (ref 98–111)
Creatinine, Ser: 0.85 mg/dL (ref 0.44–1.00)
GFR calc Af Amer: >60 mL/min (ref >60)
GFR calc non Af Amer: >60 mL/min (ref >60)
GLUCOSE: 90 mg/dL (ref 70–99)
POTASSIUM: 3.8 mmol/L (ref 3.5–5.1)
Sodium: 137 mmol/L (ref 135–145)
Total Bilirubin: 0.3 mg/dL (ref 0.3–1.2)
Total Protein: 7.1 g/dL (ref 6.5–8.1)

## 2017-12-05 LAB — URINALYSIS, ROUTINE W REFLEX MICROSCOPIC
Bilirubin Urine: NEGATIVE
GLUCOSE, UA: NEGATIVE mg/dL
Hgb urine dipstick: NEGATIVE
Ketones, ur: NEGATIVE mg/dL
LEUKOCYTES UA: NEGATIVE
Nitrite: NEGATIVE
Protein, ur: NEGATIVE mg/dL
Specific Gravity, Urine: 1.025 (ref 1.005–1.030)
pH: 6.5 (ref 5.0–8.0)

## 2017-12-05 LAB — LIPASE, BLOOD: Lipase: 24 U/L (ref 11–51)

## 2017-12-05 MED ORDER — METHOCARBAMOL 500 MG PO TABS: 500.0000 mg | ORAL_TABLET | Freq: Two times a day (BID) | ORAL | 0 refills | Status: AC

## 2017-12-05 NOTE — ED Provider Notes (Signed)
[facility] EMERGENCY DEPARTMENT Provider Note   CSN: [id number] Arrival date & time: 12/05/17  1744     History   Chief Complaint Chief Complaint  Patient presents with  . Abdominal Pain    HPI Pamela Obrien is a 27 y.o. female.  27 y/o female with a PMH of anxiety, bipolar, depression to the ED with a chief complaint of abdominal pain that began morning.  She states she was getting ready for work when she stood up from the bed and began to feel this crampy pain along her right flank radiating to her right lower quadrant.  Reports the pain is worse when she moves and palpation.  The pain is better when she is at rest or laying down flat.  Reports one episode of nausea and emesis.  She states she has tried applying a heating pad to her area along with taking ibuprofen but states no relieving symptoms.  Last menstrual period was last Sunday and lasted for a total 4 days, regular flow.  Patient has a Nexplanon as birth control.  Denies any urinary symptoms, diarrhea, gynecological complaints, chest pain or shortness of breath.     Past Medical History:  Diagnosis Date  . Anxiety   . Bipolar disorder (HCC)   . Chlamydia   . Depression   . Panic attack     There are no active problems to display for this patient.   History reviewed. No pertinent surgical history.   OB History    Gravida  1   Para      Term      Preterm      AB      Living        SAB      TAB      Ectopic      Multiple      Live Births               Home Medications    Prior to Admission medications   Medication Sig Start Date End Date Taking? Authorizing Provider  methocarbamol (ROBAXIN) 500 MG tablet Take 1 tablet (500 mg total) by mouth 2 (two) times daily for 7 days. 12/05/17 12/12/17  Claude Manges, PA-C    Family History No family history on file.  Social History Social History   Tobacco Use  . Smoking status: Former Smoker    Packs/day: 0.50  . Smokeless  tobacco: Never Used  Substance Use Topics  . Alcohol use: Yes    Comment: occ  . Drug use: No     Allergies   Patient has no known allergies.   Review of Systems Review of Systems  HENT: Negative for sore throat.   Respiratory: Negative for shortness of breath and wheezing.   Cardiovascular: Negative for chest pain.  Gastrointestinal: Positive for abdominal pain. Negative for nausea and vomiting.  Genitourinary: Positive for flank pain. Negative for dysuria and hematuria.  Musculoskeletal: Negative for back pain.  Neurological: Negative for light-headedness and headaches.  All other systems reviewed and are negative.    Physical Exam Updated Vital Signs BP 128/88 (BP Location: Left Arm)   Pulse (!) 57   Temp 99 F (37.2 C) (Oral)   Resp 18   Ht 4\' 10"  (1.473 m)   Wt 98 kg   LMP 11/25/2017   SpO2 100%   BMI 45.14 kg/m   Physical Exam  Constitutional: She appears well-developed and well-nourished.  HENT:  Head: Normocephalic and atraumatic.  Cardiovascular: Normal rate.  Pulmonary/Chest: Effort normal and breath sounds normal. She has no wheezes.      Abdominal: Soft. Normal appearance. Bowel sounds are decreased. There is tenderness in the suprapubic area. There is CVA tenderness (right sided).  Skin: Skin is warm and dry.  Nursing note and vitals reviewed.    ED Treatments / Results  Labs (all labs ordered are listed, but only abnormal results are displayed) Labs Reviewed  CBC WITH DIFFERENTIAL/PLATELET  COMPREHENSIVE METABOLIC PANEL  LIPASE, BLOOD  URINALYSIS, ROUTINE W REFLEX MICROSCOPIC    EKG None  Radiology No results found.  Procedures Procedures (including critical care time)  Medications Ordered in ED Medications - No data to display   Initial Impression / Assessment and Plan / ED Course  I have reviewed the triage vital signs and the nursing notes.  Pertinent labs & imaging results that were available during my care of the  patient were reviewed by me and considered in my medical decision making (see chart for details).    Patient presents with right flank pain which began this morning while she was getting ready for work.  CT showed no leukocytosis, CMP showed no electrolyte abnormality.  Her UA was clean no nitrites, leukocytes,hgb.   At this time patient's work-up has been unremarkable, patient's pain is reproducible with palpation at this time I will treat patient for muscle spasm as she states he is experiencing this.  Urine showed no signs of blood, leukocytosis at this time I do not believe patient is suffering from any nephrolithiasis.  She has no pain with palpation of the right lower quadrant at McBurney's point I believe this is less likely to be appendicitis.  I will send patient home with muscle relaxers to control her pain.  Return precautions have been discussed with patient at length. Final Clinical Impressions(s) / ED Diagnoses   Final diagnoses:  Right flank pain  Muscle strain    ED Discharge Orders         Ordered    methocarbamol (ROBAXIN) 500 MG tablet  2 times daily     12/05/17 2017           Claude Manges, PA-C 12/05/17 2018    Raeford Razor, MD 12/10/17 [phone]

## 2017-12-05 NOTE — ED Triage Notes (Signed)
C/o right side abd pain and right flank pain x 2 days-NAD-steady gait

## 2017-12-05 NOTE — Discharge Instructions (Addendum)
I have prescribed muscle relaxers for your pain, please do not drink or drive while taking this medications as they can make you drowsy.  Please follow-up with PCP in 1 week for reevaluation of your symptoms.  You experience any bowel or bladder incontinence, fever, worsening in your symptoms please return to the ED. ° °

## 2019-08-11 ENCOUNTER — Other Ambulatory Visit: Payer: Self-pay

## 2019-08-11 ENCOUNTER — Emergency Department (HOSPITAL_BASED_OUTPATIENT_CLINIC_OR_DEPARTMENT_OTHER)
Admission: EM | Admit: 2019-08-11 | Discharge: 2019-08-11 | Disposition: A | Discharge status: Home / Self Care | Payer: BLUE CROSS/BLUE SHIELD | Attending: Emergency Medicine | Admitting: Emergency Medicine

## 2019-08-11 ENCOUNTER — Emergency Department (HOSPITAL_BASED_OUTPATIENT_CLINIC_OR_DEPARTMENT_OTHER): Payer: BLUE CROSS/BLUE SHIELD

## 2019-08-11 ENCOUNTER — Encounter (HOSPITAL_BASED_OUTPATIENT_CLINIC_OR_DEPARTMENT_OTHER): Payer: Self-pay

## 2019-08-11 DIAGNOSIS — Z87891 Personal history of nicotine dependence: Secondary | ICD-10-CM | POA: Diagnosis not present

## 2019-08-11 DIAGNOSIS — R0602 Shortness of breath: Secondary | ICD-10-CM | POA: Diagnosis present

## 2019-08-11 DIAGNOSIS — B349 Viral infection, unspecified: Secondary | ICD-10-CM | POA: Diagnosis not present

## 2019-08-11 DIAGNOSIS — Z20822 Contact with and (suspected) exposure to covid-19: Secondary | ICD-10-CM | POA: Diagnosis not present

## 2019-08-11 LAB — SARS CORONAVIRUS 2 BY RT PCR (HOSPITAL ORDER, PERFORMED IN ~~LOC~~ HOSPITAL LAB): SARS Coronavirus 2: NEGATIVE

## 2019-08-11 MED ORDER — ALBUTEROL SULFATE HFA 108 (90 BASE) MCG/ACT IN AERS
2.0000 | INHALATION_SPRAY | Freq: Once | RESPIRATORY_TRACT | Status: AC
  Administered 2019-08-11: 2 via RESPIRATORY_TRACT
  Filled 2019-08-11: qty 6.7

## 2019-08-11 MED ORDER — NAPROXEN 250 MG PO TABS: 500.0000 mg | ORAL_TABLET | Freq: Two times a day (BID) | ORAL | 0 refills | Status: AC

## 2019-08-11 MED ORDER — BENZONATATE 100 MG PO CAPS: 100.0000 mg | ORAL_CAPSULE | Freq: Three times a day (TID) | ORAL | 0 refills | Status: AC

## 2019-08-11 MED ORDER — FLUTICASONE PROPIONATE 50 MCG/ACT NA SUSP: 1.0000 | Freq: Every day | NASAL | 0 refills | Status: AC

## 2019-08-11 NOTE — ED Triage Notes (Signed)
Pt c/o SOB, dizziness, chest congestion, chills, cough-sx started yesterday-denies known fever-NAD-to tx area in w/c

## 2019-08-11 NOTE — ED Provider Notes (Signed)
[facility] EMERGENCY DEPARTMENT Provider Note   CSN: [id number] Arrival date & time: 08/11/19  1302     History Chief Complaint  Patient presents with  . Shortness of Breath    Pamela Obrien is a 29 y.o. female presenting for evaluation of shortness of breath, chest pain, cough, nasal congestion.  Patient states her symptoms began yesterday.  She reports nasal congestion and postnasal drip.  She reports a nonproductive cough.  She reports central chest pain, worse with certain positions and movements.  It is worse when she coughs.  She denies documented or known fever, but states that she is having intermittent chills.  She denies sick contacts or known contact with COVID-19 positive person.  She has not yet received the Covid vaccines.  She denies associated nausea, vomiting, abd pain, urinary symptoms, abnormal bowel movements.  Patient states she took 1 dose of Robitussin last night without improvement, has not tried anything else. Additional history taken chart review.  Patient with a history of anxiety and bipolar.  HPI     Past Medical History:  Diagnosis Date  . Anxiety   . Bipolar disorder (HCC)   . Chlamydia   . Depression   . Panic attack     There are no problems to display for this patient.   History reviewed. No pertinent surgical history.   OB History    Gravida  1   Para      Term      Preterm      AB      Living        SAB      TAB      Ectopic      Multiple      Live Births              No family history on file.  Social History   Tobacco Use  . Smoking status: Former Smoker    Packs/day: 0.50    Types: Cigarettes  . Smokeless tobacco: Never Used  Substance Use Topics  . Alcohol use: Yes    Comment: occ  . Drug use: No    Home Medications Prior to Admission medications   Medication Sig Start Date End Date Taking? Authorizing Provider  benzonatate (TESSALON) 100 MG capsule Take 1 capsule (100 mg total) by  mouth every 8 (eight) hours. 08/11/19   Wilmer Berryhill, PA-C  fluticasone (FLONASE) 50 MCG/ACT nasal spray Place 1 spray into both nostrils daily. 08/11/19   Alain Deschene, PA-C  naproxen (NAPROSYN) 250 MG tablet Take 2 tablets (500 mg total) by mouth 2 (two) times daily with a meal. 08/11/19   Apolinar Bero, PA-C    Allergies    Patient has no known allergies.  Review of Systems   Review of Systems  Constitutional: Positive for chills and fever (Subjective).  HENT: Positive for congestion.   Respiratory: Positive for cough and shortness of breath.   Cardiovascular: Positive for chest pain.  All other systems reviewed and are negative.   Physical Exam Updated Vital Signs BP 118/74 (BP Location: Right Arm)   Pulse 68   Temp 98.7 F (37.1 C) (Oral)   Resp 18   Ht 4\' 11"  (1.499 m)   Wt 85.3 kg   LMP 08/08/2019   SpO2 99%   BMI 37.97 kg/m   Physical Exam Vitals and nursing note reviewed.  Constitutional:      General: She is not in acute distress.    Appearance:  She is well-developed. She is obese.     Comments: Obese female resting comfortably in the bed in no acute distress  HENT:     Head: Normocephalic and atraumatic.  Eyes:     Extraocular Movements: Extraocular movements intact.     Conjunctiva/sclera: Conjunctivae normal.     Pupils: Pupils are equal, round, and reactive to light.  Cardiovascular:     Rate and Rhythm: Normal rate and regular rhythm.     Pulses: Normal pulses.  Pulmonary:     Effort: Pulmonary effort is normal. No respiratory distress.     Breath sounds: Normal breath sounds. No wheezing.     Comments: Clear lung sounds in all fields.  Intermittent dry cough noted throughout exam.  Sats stable on room air.  No accessory muscle use or signs of respiratory distress. Abdominal:     General: There is no distension.     Palpations: Abdomen is soft. There is no mass.     Tenderness: There is no abdominal tenderness. There is no guarding or  rebound.  Musculoskeletal:        General: Normal range of motion.     Cervical back: Normal range of motion and neck supple.     Right lower leg: No edema.     Left lower leg: No edema.     Comments: No leg pain or swelling  Skin:    General: Skin is warm and dry.     Capillary Refill: Capillary refill takes less than 2 seconds.  Neurological:     Mental Status: She is alert and oriented to person, place, and time.     ED Results / Procedures / Treatments   Labs (all labs ordered are listed, but only abnormal results are displayed) Labs Reviewed  SARS CORONAVIRUS 2 BY RT PCR [facility] 2 ORDER, PERFORMED IN [facility])    EKG EKG Interpretation  Date/Time:  Monday August 11 2019 13:35:36 EDT Ventricular Rate:  70 PR Interval:    QRS Duration: 89 QT Interval:  388 QTC Calculation: 419 R Axis:   97 Text Interpretation: Unknown rhythm, irregular rate Borderline right axis deviation Borderline T wave abnormalities Baseline wander in lead(s) V2 No significant change since last tracing Confirmed by Melene Plan [phone]) on 08/11/2019 1:39:43 PM   Radiology DG Chest Portable 1 View  Result Date: 08/11/2019 CLINICAL DATA:  Cough and shortness of breath. EXAM: PORTABLE CHEST 1 VIEW COMPARISON:  Chest x-ray dated May 04, 2014. FINDINGS: The heart size and mediastinal contours are within normal limits. Both lungs are clear. The visualized skeletal structures are unremarkable. IMPRESSION: No active disease. Electronically Signed   By: Obie Dredge M.D.   On: 08/11/2019 14:06    Procedures Procedures (including critical care time)  Medications Ordered in ED Medications  albuterol (VENTOLIN HFA) 108 (90 Base) MCG/ACT inhaler 2 puff (2 puffs Inhalation Given 08/11/19 1403)    ED Course  I have reviewed the triage vital signs and the nursing notes.  Pertinent labs & imaging results that were available during my care of the patient were reviewed by me and considered in my  medical decision making (see chart for details).    MDM Rules/Calculators/A&P                      Patient presenting for evaluation of cough, congestion, shortness of breath, and chest pain.  On exam, patient appears nontoxic.  She is not tachycardic, hypoxic, or hypotensive.  As  such, low suspicion for PE, especially in the setting of other viral symptoms.  Consider pneumonia, especially with subjective fevers.  Consider Covid.  Doubt ACS.  Will give albuterol for pleuritic pain and reassess.  Chest x-ray viewed interpreted by me, no pneumonia can, confusion, cardiomegaly.  EKG shows normal sinus rhythm.  Covid test is negative.  Discussed findings with patient.  Discussed likely viral illness, and continued symptomatic treatment.  On reassessment after albuterol, patient reports improvement in chest pain and breathing.  She remains nontoxic in appearance.  At this time, patient appears safe for discharge.  Return precautions given.  Patient states she understands and agrees to plan.  Loanne Emery was evaluated in Emergency Department on 08/11/2019 for the symptoms described in the history of present illness. She was evaluated in the context of the global COVID-19 pandemic, which necessitated consideration that the patient might be at risk for infection with the SARS-CoV-2 virus that causes COVID-19. Institutional protocols and algorithms that pertain to the evaluation of patients at risk for COVID-19 are in a state of rapid change based on information released by regulatory bodies including the CDC and federal and state organizations. These policies and algorithms were followed during the patient's care in the ED.  Final Clinical Impression(s) / ED Diagnoses Final diagnoses:  Viral illness    Rx / DC Orders ED Discharge Orders         Ordered    fluticasone (FLONASE) 50 MCG/ACT nasal spray  Daily     08/11/19 1447    naproxen (NAPROSYN) 250 MG tablet  2 times daily with meals     08/11/19  1447    benzonatate (TESSALON) 100 MG capsule  Every 8 hours     08/11/19 [place], Donita Newland, PA-C 08/11/19 [place], [place], DO 08/12/19 0827

## 2019-08-11 NOTE — ED Notes (Signed)
ED Provider at bedside. 

## 2019-08-11 NOTE — Discharge Instructions (Signed)
You likely have a viral illness.  This should be treated symptomatically. Take naproxen 2 times a day with meals.  Do not take other anti-inflammatories at the same time (Advil, Motrin, ibuprofen, Aleve). You may supplement with Tylenol if you need further pain control. Take tessalon as needed for cough. Use Flonase daily for nasal congestion and cough. Use the inhaler every 4 hours for the next 2 days. After this, take as needed for wheezing, chest tightness.  Make sure you stay well-hydrated with water. Wash your hands frequently to prevent spread of infection. Follow-up with your primary care doctor in 1 week if your symptoms are not improving. Return to the emergency room if you develop chest pain, difficulty breathing, or any new or worsening symptoms.

## 2020-06-29 ENCOUNTER — Encounter (HOSPITAL_BASED_OUTPATIENT_CLINIC_OR_DEPARTMENT_OTHER): Payer: Self-pay

## 2020-06-29 ENCOUNTER — Other Ambulatory Visit: Payer: Self-pay

## 2020-06-29 ENCOUNTER — Emergency Department (HOSPITAL_BASED_OUTPATIENT_CLINIC_OR_DEPARTMENT_OTHER)
Admission: EM | Admit: 2020-06-29 | Discharge: 2020-06-29 | Disposition: A | Discharge status: Home / Self Care | Payer: BLUE CROSS/BLUE SHIELD | Attending: Emergency Medicine | Admitting: Emergency Medicine

## 2020-06-29 DIAGNOSIS — H1031 Unspecified acute conjunctivitis, right eye: Secondary | ICD-10-CM

## 2020-06-29 DIAGNOSIS — Z87891 Personal history of nicotine dependence: Secondary | ICD-10-CM | POA: Insufficient documentation

## 2020-06-29 DIAGNOSIS — H1089 Other conjunctivitis: Secondary | ICD-10-CM | POA: Insufficient documentation

## 2020-06-29 MED ORDER — TETRACAINE HCL 0.5 % OP SOLN
OPHTHALMIC | Status: AC
  Filled 2020-06-29: qty 4

## 2020-06-29 MED ORDER — FLUORESCEIN SODIUM 1 MG OP STRP
ORAL_STRIP | OPHTHALMIC | Status: AC
  Filled 2020-06-29: qty 1

## 2020-06-29 MED ORDER — POLYMYXIN B-TRIMETHOPRIM 10000-0.1 UNIT/ML-% OP SOLN: 2.0000 [drp] | OPHTHALMIC | 0 refills | Status: AC

## 2020-06-29 NOTE — ED Notes (Signed)
Presents with rt eye/ rt side facial pain, rt eye is very red in appearance, client states she noted "pus like" drainage also from eye. Denies any visual disturbances. Rt eye lid and surrounding tissue appears swollen as well.

## 2020-06-29 NOTE — ED Notes (Signed)
Woods lamp, fluro strip and tetracaine eye gtts to bedside

## 2020-06-29 NOTE — ED Notes (Signed)
AVS reviewed with client, informed that Rx was electronically sent to the pharmacy she has on the EMR. Also discussed the importance of washing hands and informed client the eye infection is contagious, work note was provided by the ED PA. Opportunity for questions provided.

## 2020-06-29 NOTE — ED Provider Notes (Signed)
[facility] EMERGENCY DEPARTMENT Provider Note   CSN: [id number] Arrival date & time: 06/29/20  1347     History Chief Complaint  Patient presents with  . Eye Problem    Pamela Obrien is a 30 y.o. female.  30 year old female with complaint of right eye redness with yellow drainage and irritation onset yesterday. Does not wear glasses or contacts, denies blurry vision, injury or chemical exposure to the eye. Patient has been applying Visine drops to the eye without improvement. Does have some relief with warm compresses.         Past Medical History:  Diagnosis Date  . Anxiety   . Bipolar disorder (HCC)   . Chlamydia   . Depression   . Panic attack     There are no problems to display for this patient.   History reviewed. No pertinent surgical history.   OB History    Gravida  1   Para      Term      Preterm      AB      Living        SAB      IAB      Ectopic      Multiple      Live Births              No family history on file.  Social History   Tobacco Use  . Smoking status: Former Smoker    Packs/day: 0.50    Types: Cigarettes  . Smokeless tobacco: Never Used  Vaping Use  . Vaping Use: Every day  Substance Use Topics  . Alcohol use: Yes    Comment: occ  . Drug use: No    Home Medications Prior to Admission medications   Medication Sig Start Date End Date Taking? Authorizing Provider  trimethoprim-polymyxin b (POLYTRIM) ophthalmic solution Place 2 drops into both eyes every 4 (four) hours. 06/29/20  Yes Jeannie Fend, PA-C  benzonatate (TESSALON) 100 MG capsule Take 1 capsule (100 mg total) by mouth every 8 (eight) hours. 08/11/19   Caccavale, Sophia, PA-C  fluticasone (FLONASE) 50 MCG/ACT nasal spray Place 1 spray into both nostrils daily. 08/11/19   Caccavale, Sophia, PA-C  naproxen (NAPROSYN) 250 MG tablet Take 2 tablets (500 mg total) by mouth 2 (two) times daily with a meal. 08/11/19   Caccavale, Sophia, PA-C     Allergies    Patient has no known allergies.  Review of Systems   Review of Systems  Constitutional: Negative for fever.  HENT: Negative for congestion, facial swelling and sneezing.   Eyes: Positive for discharge, redness and itching. Negative for photophobia, pain and visual disturbance.  Respiratory: Negative for cough.   Skin: Negative for rash and wound.  Allergic/Immunologic: Negative for immunocompromised state.  Hematological: Negative for adenopathy.    Physical Exam Updated Vital Signs BP (!) 139/96 (BP Location: Left Arm)   Pulse 77   Temp 98.2 F (36.8 C) (Oral)   Resp 18   Ht 4\' 11"  (1.499 m)   Wt 105.7 kg   LMP 06/07/2020   SpO2 100%   BMI 47.06 kg/m   Physical Exam Vitals and nursing note reviewed.  Constitutional:      General: She is not in acute distress.    Appearance: She is well-developed. She is not diaphoretic.  HENT:     Head: Normocephalic and atraumatic.  Eyes:     General:  Right eye: Discharge present.        Left eye: No discharge.     Extraocular Movements: Extraocular movements intact.     Conjunctiva/sclera:     Right eye: Right conjunctiva is injected. No chemosis.    Left eye: Left conjunctiva is not injected. No chemosis.    Pupils: Pupils are equal, round, and reactive to light.  Pulmonary:     Effort: Pulmonary effort is normal.  Skin:    General: Skin is warm and dry.     Findings: No erythema or rash.  Neurological:     Mental Status: She is alert and oriented to person, place, and time.  Psychiatric:        Behavior: Behavior normal.     ED Results / Procedures / Treatments   Labs (all labs ordered are listed, but only abnormal results are displayed) Labs Reviewed - No data to display  EKG None  Radiology No results found.  Procedures Procedures   Medications Ordered in ED Medications  tetracaine (PONTOCAINE) 0.5 % ophthalmic solution (  Not Given 06/29/20 1419)  fluorescein 1 MG ophthalmic  strip (  Not Given 06/29/20 1418)    ED Course  I have reviewed the triage vital signs and the nursing notes.  Pertinent labs & imaging results that were available during my care of the patient were reviewed by me and considered in my medical decision making (see chart for details).  Clinical Course as of 06/29/20 1434  Tue Jun 29, 2020  5119 30 year old female with complaint of right eye redness with yellow drainage. On exam found to have injection or conjunctiva on right eye.  Plan is to treat for acute bacterial conjunctivitis with antibiotic drops, advised to apply to both eyes, return precautions given.  [LM]    Clinical Course User Index [LM] Alden Hipp   MDM Rules/Calculators/A&P                          Final Clinical Impression(s) / ED Diagnoses Final diagnoses:  Acute bacterial conjunctivitis of right eye    Rx / DC Orders ED Discharge Orders         Ordered    trimethoprim-polymyxin b (POLYTRIM) ophthalmic solution  Every 4 hours        06/29/20 1419           Jeannie Fend, PA-C 06/29/20 1434    Long, Arlyss Repress, MD 07/02/20 [phone]

## 2020-06-29 NOTE — ED Triage Notes (Signed)
Pt c/o redness, swelling, pain, drainage right eye started yesterday-NAD-steady gait
